# Patient Record
Sex: Female | Born: 1965 | Race: Asian | Hispanic: No | Marital: Married | Smoking: Never smoker
Health system: Southern US, Community
[De-identification: ages and names within clinical notes are randomized; demographics above are authoritative.]

## PROBLEM LIST (undated history)

## (undated) DIAGNOSIS — Z5189 Encounter for other specified aftercare: Secondary | ICD-10-CM

## (undated) DIAGNOSIS — B1910 Unspecified viral hepatitis B without hepatic coma: Secondary | ICD-10-CM

## (undated) DIAGNOSIS — G43909 Migraine, unspecified, not intractable, without status migrainosus: Secondary | ICD-10-CM

## (undated) DIAGNOSIS — D649 Anemia, unspecified: Secondary | ICD-10-CM

## (undated) HISTORY — DX: Migraine, unspecified, not intractable, without status migrainosus: G43.909

## (undated) HISTORY — DX: Anemia, unspecified: D64.9

## (undated) HISTORY — DX: Unspecified viral hepatitis B without hepatic coma: B19.10

## (undated) HISTORY — DX: Encounter for other specified aftercare: Z51.89

---

## 2005-06-14 ENCOUNTER — Ambulatory Visit (HOSPITAL_COMMUNITY): Admission: RE | Admit: 2005-06-14 | Discharge: 2005-06-14 | Payer: Self-pay | Admitting: *Deleted

## 2006-01-07 ENCOUNTER — Other Ambulatory Visit: Admission: RE | Admit: 2006-01-07 | Discharge: 2006-01-07 | Payer: Self-pay | Admitting: Obstetrics and Gynecology

## 2006-06-18 ENCOUNTER — Ambulatory Visit (HOSPITAL_COMMUNITY): Admission: RE | Admit: 2006-06-18 | Discharge: 2006-06-18 | Payer: Self-pay | Admitting: Internal Medicine

## 2006-06-28 ENCOUNTER — Encounter: Admission: RE | Admit: 2006-06-28 | Discharge: 2006-06-28 | Payer: Self-pay | Admitting: Internal Medicine

## 2006-12-03 ENCOUNTER — Ambulatory Visit (HOSPITAL_BASED_OUTPATIENT_CLINIC_OR_DEPARTMENT_OTHER): Admission: RE | Admit: 2006-12-03 | Discharge: 2006-12-03 | Payer: Self-pay | Admitting: Urology

## 2007-03-29 ENCOUNTER — Emergency Department (HOSPITAL_COMMUNITY): Admission: EM | Admit: 2007-03-29 | Discharge: 2007-03-30 | Payer: Self-pay | Admitting: Emergency Medicine

## 2007-06-05 ENCOUNTER — Emergency Department (HOSPITAL_COMMUNITY): Admission: EM | Admit: 2007-06-05 | Discharge: 2007-06-05 | Payer: Self-pay | Admitting: Emergency Medicine

## 2007-08-18 ENCOUNTER — Emergency Department (HOSPITAL_COMMUNITY): Admission: EM | Admit: 2007-08-18 | Discharge: 2007-08-18 | Payer: Self-pay | Admitting: Emergency Medicine

## 2007-11-27 ENCOUNTER — Encounter: Admission: RE | Admit: 2007-11-27 | Discharge: 2007-11-27 | Payer: Self-pay | Admitting: Gastroenterology

## 2008-01-15 ENCOUNTER — Ambulatory Visit (HOSPITAL_COMMUNITY): Admission: RE | Admit: 2008-01-15 | Discharge: 2008-01-15 | Payer: Self-pay | Admitting: Family Medicine

## 2008-08-31 IMAGING — CR DG MANDIBLE 1-3V
4 series · 4 of 4 positions shown · non-contrast
Comparison: none

CLINICAL DATA: 41 year-old-female jaw locked open.  
 MANDIBLE ? 4 VIEW:

[w townes *]
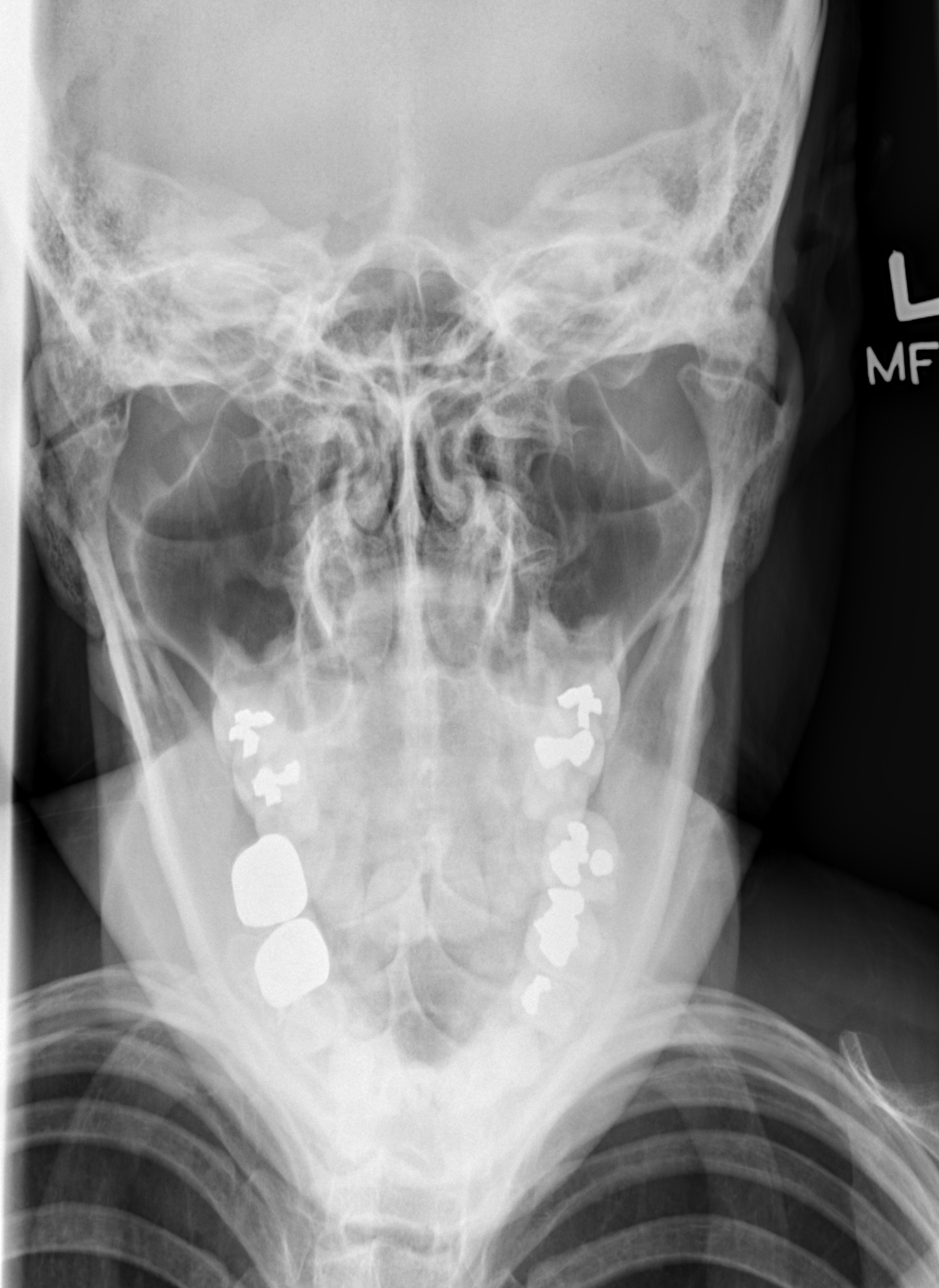

[w skull a.p./p.a.]
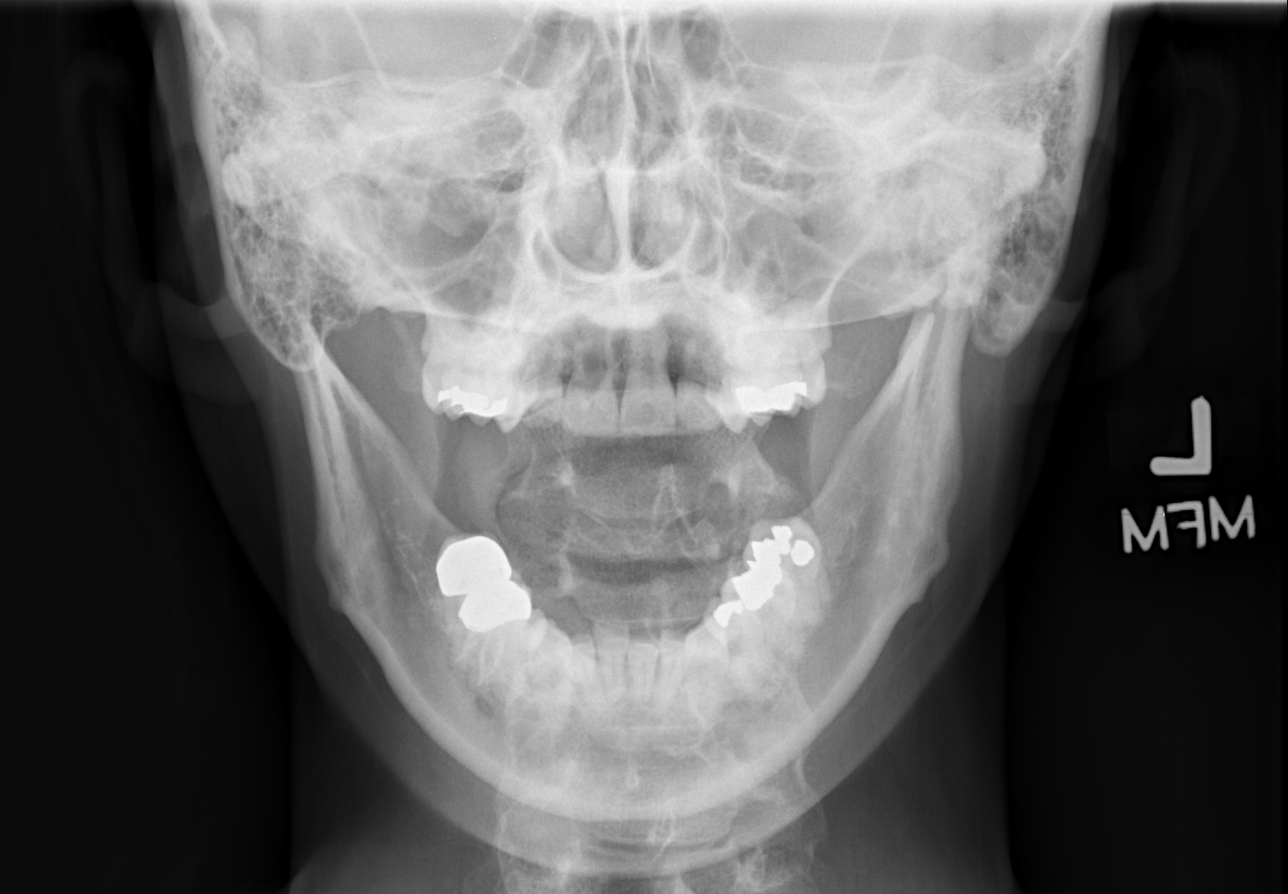

[w skull lat *]
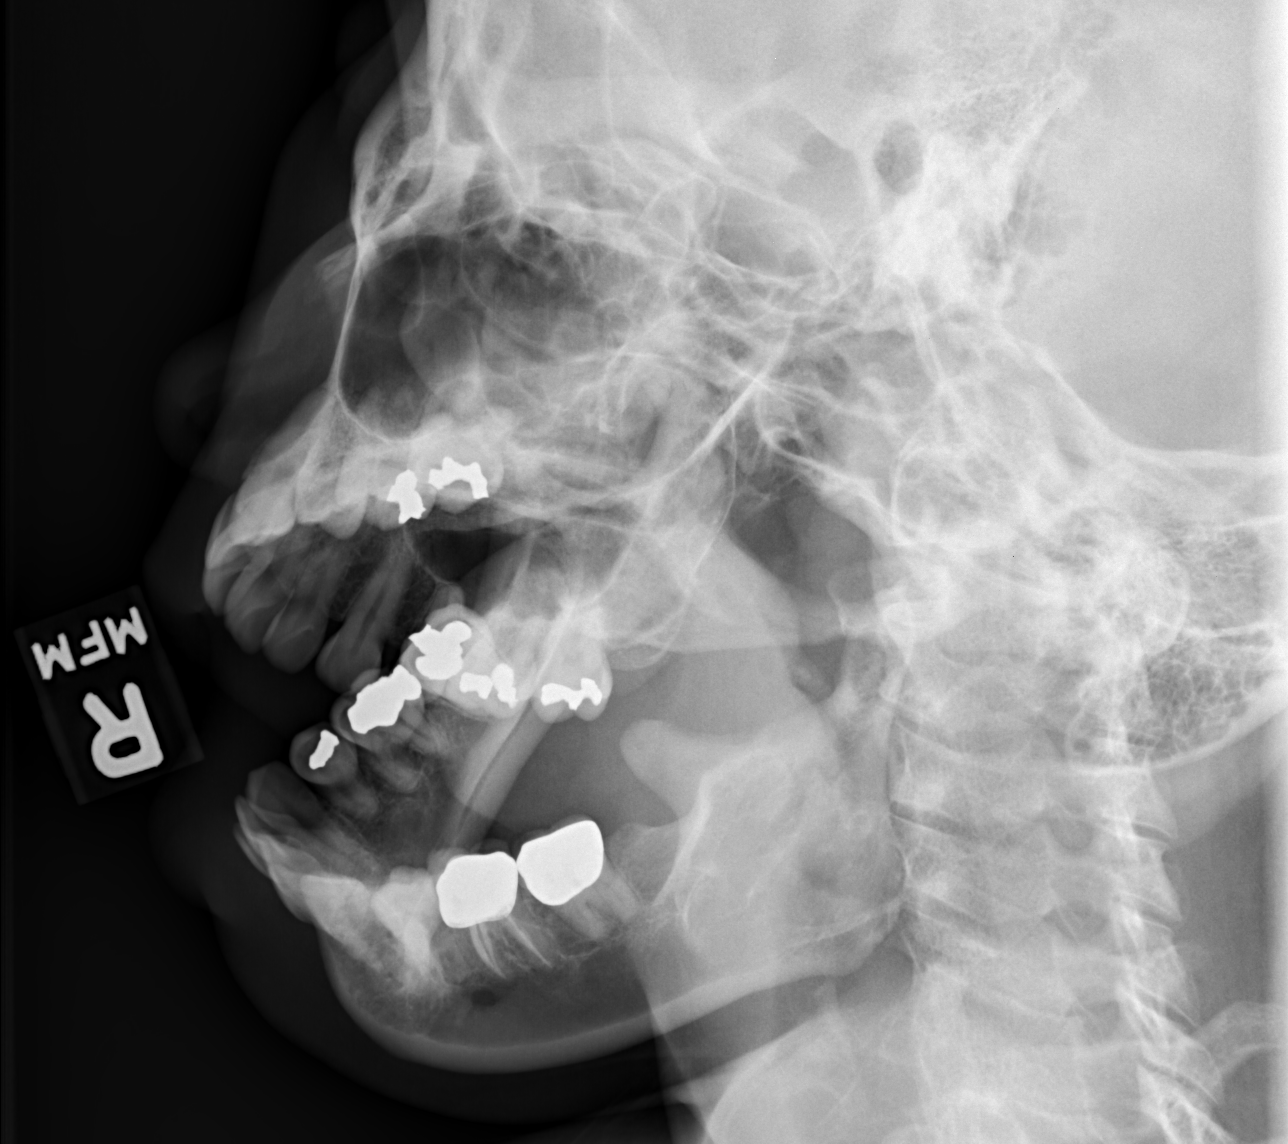

[w skull lat]
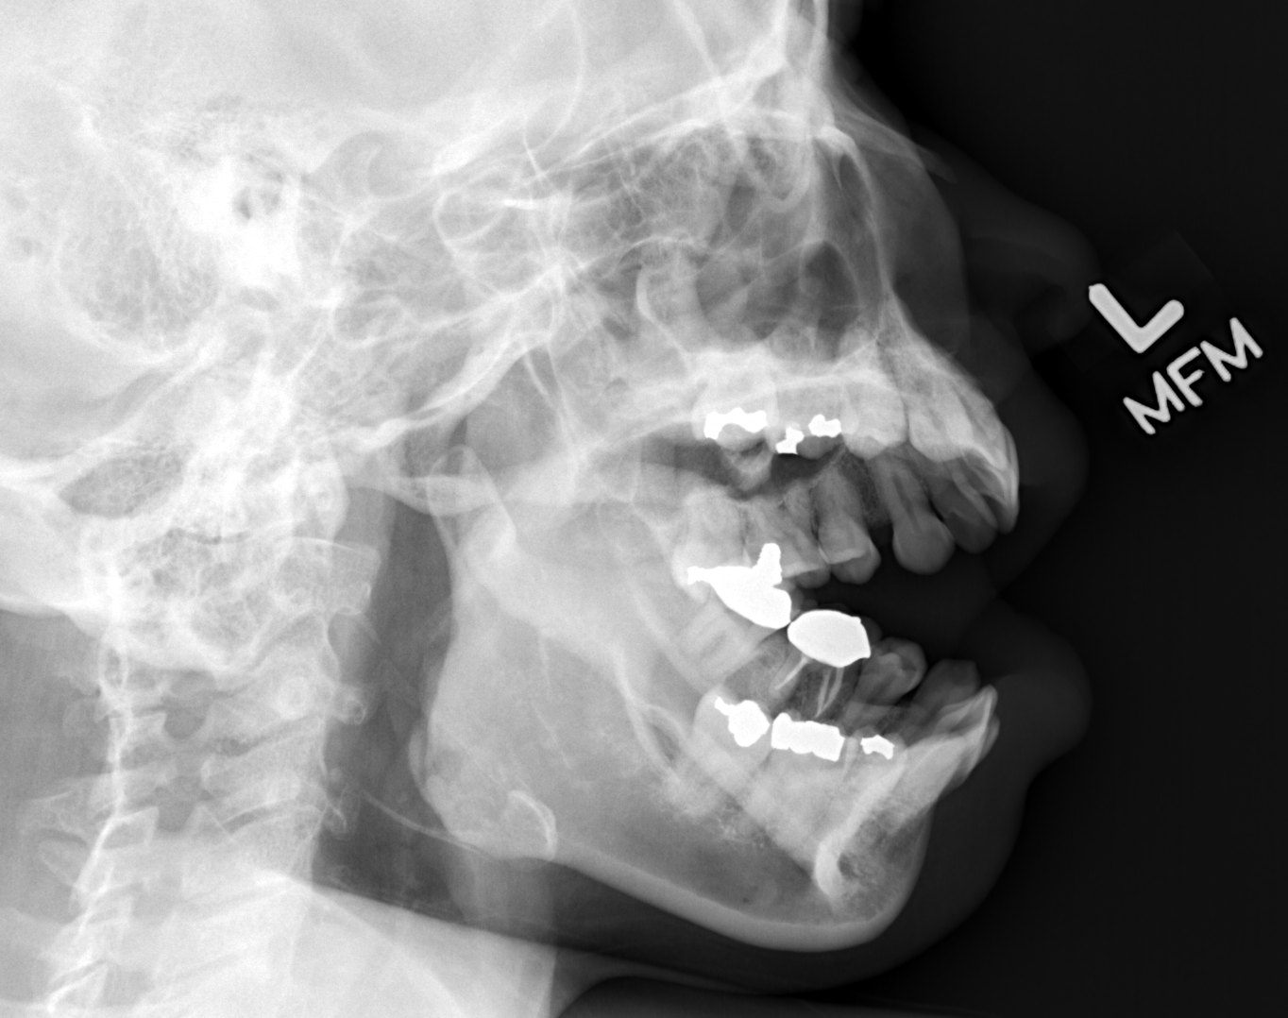

[4 of 4 positions shown; findings below may reference images not displayed]

FINDINGS: No mandible fractures are seen.  Both mandibular condyles are located under the condylar eminence in a slightly forward position which is not unusual for the patient having their mouth open.  The findings may be due to temporomandibular disc disease.  MR Reck be helpful for further evaluation of this finding.
IMPRESSION: 1.   No mandible fracture is seen.
 2.  Both mandibular condyles are translated anteriorly under the condylar eminence which is normal with the mouth open.  The findings may be due to temporomandibular disc disease.   MR Reck be helpful for further evaluation of this.

## 2009-05-17 ENCOUNTER — Emergency Department (HOSPITAL_COMMUNITY): Admission: EM | Admit: 2009-05-17 | Discharge: 2009-05-18 | Payer: Self-pay | Admitting: Emergency Medicine

## 2009-07-18 ENCOUNTER — Encounter: Admission: RE | Admit: 2009-07-18 | Discharge: 2009-07-18 | Payer: Self-pay | Admitting: Gastroenterology

## 2010-03-08 ENCOUNTER — Emergency Department (HOSPITAL_COMMUNITY)
Admission: EM | Admit: 2010-03-08 | Discharge: 2010-03-08 | Payer: Self-pay | Source: Home / Self Care | Admitting: Emergency Medicine

## 2010-03-10 ENCOUNTER — Encounter
Admission: RE | Admit: 2010-03-10 | Discharge: 2010-03-10 | Payer: Self-pay | Source: Home / Self Care | Attending: Orthopedic Surgery | Admitting: Orthopedic Surgery

## 2010-07-12 ENCOUNTER — Other Ambulatory Visit: Payer: Self-pay | Admitting: Gastroenterology

## 2010-07-12 DIAGNOSIS — C220 Liver cell carcinoma: Secondary | ICD-10-CM

## 2010-07-14 ENCOUNTER — Other Ambulatory Visit: Payer: Self-pay

## 2010-07-20 ENCOUNTER — Other Ambulatory Visit: Payer: Self-pay

## 2010-07-20 ENCOUNTER — Ambulatory Visit
Admission: RE | Admit: 2010-07-20 | Discharge: 2010-07-20 | Disposition: A | Discharge status: Home / Self Care | Payer: BC Managed Care – PPO | Source: Ambulatory Visit | Attending: Gastroenterology | Admitting: Gastroenterology

## 2010-07-20 DIAGNOSIS — C220 Liver cell carcinoma: Secondary | ICD-10-CM

## 2010-08-15 NOTE — Op Note (Signed)
NAMEARNETT, GALINDEZ                 ACCOUNT NO.:  [id number]   MEDICAL RECORD NO.:  [id number]         PATIENT TYPE:  LAMB   LOCATION:                               FACILITY:  [facility]   PHYSICIAN:  Martina Sinner, MD DATE OF BIRTH:  10-26-1965   DATE OF PROCEDURE:  12/03/2006  DATE OF DISCHARGE:                               OPERATIVE REPORT   PREOPERATIVE DIAGNOSIS:  Stress urinary incontinence and pelvic  pressure.   POSTOPERATIVE DIAGNOSIS:  Stress urinary incontinence, interstitial  cystitis.   PROCEDURE PERFORMED:  Sling cystourethropexy [facility]) plus cystoscopy  plus hydrodistention.   Ms. Muzio has urinary frequency associated with pelvic pressure.  Symptoms have returned.  She still voiding frequently.  She has stress  incontinence.   The patient is prepped and draped in the usual fashion.  Extra care was  taken to position her legs to minimize the risk compartment syndrome,  neuropathy and DVT. She was given preoperative antibiotics and  preoperative lab tests were normal.   Two 1-cm incisions were made 1 fingerbreadth above the symphysis pubis,  1.5 cm lateral to the midline.  A 2-cm incision was made overlying the  mid urethra.  Lidocaine/epinephrine mixture was utilized for hemostasis  and to develop a thick pubocervical flap.  I sharply dissect the  urethrovesical angle bilaterally.   With the bladder emptied, I passed the [facility] needle on top of and along  the back of the symphysis pubis parallel to the midline.  I delivered  the needle onto the palp of my index finger bilaterally.  I then  cystoscoped the patient.  There is no bladder injury.  There was no  urethral injury.  There was efflux of indigo carmine from both ureteral  orifices.  With movement of the trocars, there was no deflection into  the bladder.   With the bladder empty, the [facility] sling was attached and brought up  through the retropubic space.  It was tensioned over the fat part of a  moderate-sized [employer].  I cut below the blue dot, irrigated the sheath  and removed the sheath.  I was very happy with the position of the sling  in the mid urethra.  It laid in flat and I could hypermobilize it in the  midline and laterally.  There is little to no spring back.   All incisions were irrigated.  Vaginal incision was closed with running  2-0 Vicryl followed by 2 interrupted sutures.  The skin was closed with  4-0 subcuticular Vicryl and Dermabond.   Prior to performing the sling, I did a cystoscopy and hydrodistention.  She was hydrodistended to 800 mL.  Her bladder mucosa initially was  completely normal.  When I re-examine her bladder after hydrodistention,  there were diffuse fine petechiae throughout the bladder.  There was  more substantial petechiae at 5 o'clock and 7 o'clock around the  ureteral orifices.  The findings were in keeping with a diagnosis of  interstitial cystitis.   Ms. Gancarz be followed as per protocol.  She will be treated for mild  interstitial cystitis.  Hopefully the sling will help her stress  incontinence.           ______________________________  Martina Sinner, MD  Electronically Signed     SAM/MEDQ  D:  12/03/2006  T:  12/03/2006  Job:  [id number]

## 2010-11-16 ENCOUNTER — Emergency Department (HOSPITAL_COMMUNITY)
Admission: EM | Admit: 2010-11-16 | Discharge: 2010-11-16 | Disposition: A | Discharge status: Home / Self Care | Payer: BC Managed Care – PPO | Attending: Emergency Medicine | Admitting: Emergency Medicine

## 2010-11-16 ENCOUNTER — Emergency Department (HOSPITAL_COMMUNITY): Payer: BC Managed Care – PPO

## 2010-11-16 DIAGNOSIS — S0300XA Dislocation of jaw, unspecified side, initial encounter: Secondary | ICD-10-CM | POA: Insufficient documentation

## 2010-11-16 DIAGNOSIS — R6884 Jaw pain: Secondary | ICD-10-CM

## 2010-11-16 DIAGNOSIS — X58XXXA Exposure to other specified factors, initial encounter: Secondary | ICD-10-CM | POA: Insufficient documentation

## 2011-01-12 LAB — CBC
MCV: 95.1
Platelets: 252
WBC: 3.6 — ABNORMAL LOW

## 2011-01-12 LAB — TYPE AND SCREEN
ABO/RH(D): AB POS
Antibody Screen: NEGATIVE

## 2011-01-12 LAB — PROTIME-INR
INR: 0.9
Prothrombin Time: 12.7

## 2011-01-12 LAB — ABO/RH: ABO/RH(D): AB POS

## 2011-05-24 ENCOUNTER — Ambulatory Visit (INDEPENDENT_AMBULATORY_CARE_PROVIDER_SITE_OTHER): Payer: BC Managed Care – PPO | Admitting: Family Medicine

## 2011-05-24 VITALS — BP 110/74 | HR 74 | Temp 97.9°F | Resp 16 | Ht 63.0 in | Wt 131.0 lb

## 2011-05-24 DIAGNOSIS — G43109 Migraine with aura, not intractable, without status migrainosus: Secondary | ICD-10-CM | POA: Insufficient documentation

## 2011-05-24 DIAGNOSIS — B009 Herpesviral infection, unspecified: Secondary | ICD-10-CM

## 2011-05-24 MED ORDER — KETOROLAC TROMETHAMINE 60 MG/2ML IM SOLN
60.0000 mg | Freq: Once | INTRAMUSCULAR | Status: AC
  Administered 2011-05-24: 60 mg via INTRAMUSCULAR

## 2011-05-24 MED ORDER — SUMATRIPTAN 20 MG/ACT NA SOLN: NASAL | Status: DC

## 2011-05-24 MED ORDER — DIAZEPAM 5 MG PO TABS: ORAL_TABLET | ORAL | Status: DC

## 2011-05-24 MED ORDER — VALACYCLOVIR HCL 1 G PO TABS: 1000.0000 mg | ORAL_TABLET | Freq: Two times a day (BID) | ORAL | Status: AC

## 2011-05-24 NOTE — Progress Notes (Signed)
  Subjective:    Patient ID: Dawn Hatfield, female    DOB: Jun 12, 1965, 46 y.o.   MRN: [id number]  Migraine  This is a new problem. The current episode started in the past 7 days. The problem occurs constantly. The problem has been unchanged. The pain is located in the right unilateral region. The pain quality is similar to prior headaches. The quality of the pain is described as pulsating. The pain is at a severity of 5/10. The pain is moderate. Pertinent negatives include no blurred vision or fever. The symptoms are aggravated by bright light, menstrual cycle and noise. She has tried darkened room and NSAIDs for the symptoms. The treatment provided mild relief. Her past medical history is significant for migraine headaches.   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  Eyes: Negative for blurred vision.   .    Objective:   Physical Exam  Constitutional: She is oriented to person, place, and time. She appears well-developed and well-nourished.  HENT:  Head: Normocephalic and atraumatic.  Eyes: EOM are normal. Pupils are equal, round, and reactive to light.  Neck: Neck supple.  Cardiovascular: Normal rate, regular rhythm and normal heart sounds.   Pulmonary/Chest: Effort normal and breath sounds normal.  Neurological: She is alert and oriented to person, place, and time. She has normal strength. No cranial nerve deficit or sensory deficit.  Psychiatric: She has a normal mood and affect.          Assessment & Plan:   1. Migraine headache with aura  ketorolac (TORADOL) injection 60 mg, diazepam (VALIUM) 5 MG tablet, SUMAtriptan (IMITREX) 20 MG/ACT nasal spray  2. HSV-1 infection  valACYclovir (VALTREX) 1000 MG tablet    Anticipatory guidance

## 2011-06-18 ENCOUNTER — Encounter: Payer: Self-pay | Admitting: Family Medicine

## 2011-06-25 ENCOUNTER — Ambulatory Visit: Payer: BC Managed Care – PPO | Admitting: Family Medicine

## 2011-06-25 VITALS — BP 123/76 | HR 58 | Temp 98.4°F | Resp 16 | Ht 63.0 in | Wt 123.0 lb

## 2011-06-25 DIAGNOSIS — R51 Headache: Secondary | ICD-10-CM

## 2011-06-25 DIAGNOSIS — E860 Dehydration: Secondary | ICD-10-CM

## 2011-06-25 DIAGNOSIS — R070 Pain in throat: Secondary | ICD-10-CM

## 2011-06-25 DIAGNOSIS — R5381 Other malaise: Secondary | ICD-10-CM

## 2011-06-25 MED ORDER — KETOROLAC TROMETHAMINE 60 MG/2ML IM SOLN
60.0000 mg | Freq: Once | INTRAMUSCULAR | Status: AC
  Administered 2011-06-25: 60 mg via INTRAMUSCULAR

## 2011-06-25 NOTE — Progress Notes (Signed)
  Subjective:    Patient ID: Dawn Hatfield, female    DOB: [birth year], 46 y.o.   MRN: [id number]  HPI 46 yo female with migraine, nausea, and dizziness.  Woke up dizzy Wednesday morning with nausea and vomitting.  Vomitted all day.  Stopped after about 20 hours and felt weak rest of the week. Today started with nausea and felt migraine coming on with her usual aura - nausea and strange vision.  No full pain yet, eventhough it has been several hours.  Has not taken any of her headache meds.    Has felt bad for several days.  "something isn't right inmy body".  "I don't feel right".  Not sleeping well because of pain from cervical pinched nerve.  Following with neurology.    Review of Systems Negative except as per HPI     Objective:   Physical Exam  Constitutional: Vital signs are normal. She appears well-developed and well-nourished. She is active and cooperative.  HENT:  Head: Normocephalic.  Right Ear: Hearing, tympanic membrane, external ear and ear canal normal.  Left Ear: Hearing, tympanic membrane, external ear and ear canal normal.  Nose: Nose normal.  Mouth/Throat: Uvula is midline, oropharynx is clear and moist and mucous membranes are normal.  Eyes: Conjunctivae, EOM and lids are normal. Pupils are equal, round, and reactive to light. Right eye exhibits no nystagmus. Left eye exhibits no nystagmus.  Cardiovascular: Normal rate, regular rhythm, normal heart sounds, intact distal pulses and normal pulses.   Pulmonary/Chest: Effort normal and breath sounds normal.  Neurological: She is alert. She has normal strength. No cranial nerve deficit. She displays a negative Romberg sign. Coordination and gait normal.          Assessment & Plan:  Viral GE  With resulting malaise and dehydration Atypical migraine  Toradol here.  Valium when she gets home.  Rehydrate.  Check labs.  If not improved in 2-3 days, return.

## 2011-06-26 ENCOUNTER — Ambulatory Visit: Payer: BC Managed Care – PPO | Admitting: Family Medicine

## 2011-06-26 ENCOUNTER — Other Ambulatory Visit: Payer: Self-pay | Admitting: Gastroenterology

## 2011-06-26 VITALS — BP 129/72 | HR 58 | Temp 98.1°F | Resp 16 | Ht 63.0 in | Wt 124.0 lb

## 2011-06-26 DIAGNOSIS — F4320 Adjustment disorder, unspecified: Secondary | ICD-10-CM

## 2011-06-26 DIAGNOSIS — R110 Nausea: Secondary | ICD-10-CM

## 2011-06-26 DIAGNOSIS — Z658 Other specified problems related to psychosocial circumstances: Secondary | ICD-10-CM

## 2011-06-26 DIAGNOSIS — B181 Chronic viral hepatitis B without delta-agent: Secondary | ICD-10-CM

## 2011-06-26 LAB — COMPREHENSIVE METABOLIC PANEL
ALT: 12 U/L (ref 0–35)
AST: 17 U/L (ref 0–37)
Albumin: 4.4 g/dL (ref 3.5–5.2)
Alkaline Phosphatase: 55 U/L (ref 39–117)
BUN: 8 mg/dL (ref 6–23)
Creat: 0.77 mg/dL (ref 0.50–1.10)
Potassium: 5 mEq/L (ref 3.5–5.3)

## 2011-06-26 LAB — CBC
HCT: 40.6 % (ref 36.0–46.0)
MCHC: 32.3 g/dL (ref 30.0–36.0)
Platelets: 344 10*3/uL (ref 150–400)
RDW: 12.7 % (ref 11.5–15.5)
WBC: 5.3 10*3/uL (ref 4.0–10.5)

## 2011-06-26 NOTE — Progress Notes (Signed)
  Subjective:    Patient ID: Dawn Hatfield, female    DOB: Jun 24, 1965, 46 y.o.   MRN: [id number]  HPI See office notes from yesterday. Continued nausea and malaise  Stressors mounting since December; issues with husbands health, children very busy, financial expenses.  Overwhellmed  Recurrent migraines Muscle tension (B) shoulders     Review of Systems     Objective:   Physical Exam  Constitutional: She appears well-developed.  Neck: Neck supple.  Cardiovascular: Normal rate, regular rhythm and normal heart sounds.   Pulmonary/Chest: Effort normal and breath sounds normal.  Neurological: She is alert.  Skin: Skin is warm.      Results for orders placed in visit on 06/25/11  CBC      Component Value Range   WBC 5.3  4.0 - 10.5 (K/uL)   RBC 4.26  3.87 - 5.11 (MIL/uL)   Hemoglobin 13.1  12.0 - 15.0 (g/dL)   HCT 95.6  21.3 - 08.6 (%)   MCV 95.3  78.0 - 100.0 (fL)   MCH 30.8  26.0 - 34.0 (pg)   MCHC 32.3  30.0 - 36.0 (g/dL)   RDW 57.8  46.9 - 62.9 (%)   Platelets 344  150 - 400 (K/uL)  COMPREHENSIVE METABOLIC PANEL      Component Value Range   Sodium 138  135 - 145 (mEq/L)   Potassium 5.0  3.5 - 5.3 (mEq/L)   Chloride 106  96 - 112 (mEq/L)   CO2 27  19 - 32 (mEq/L)   Glucose, Bld 95  70 - 99 (mg/dL)   BUN 8  6 - 23 (mg/dL)   Creat 5.28  4.13 - 2.44 (mg/dL)   Total Bilirubin 0.4  0.3 - 1.2 (mg/dL)   Alkaline Phosphatase 55  39 - 117 (U/L)   AST 17  0 - 37 (U/L)   ALT 12  0 - 35 (U/L)   Total Protein 7.4  6.0 - 8.3 (g/dL)   Albumin 4.4  3.5 - 5.2 (g/dL)   Calcium 9.2  8.4 - 01.0 (mg/dL)  TSH      Component Value Range   TSH 1.416  0.350 - 4.500 (uIU/mL)       Assessment & Plan:   1. Adjustment disorder   2. Psychosocial stressors    Out of work 3/20- 4/8 Continue current medications Stress management Extended OV

## 2011-07-02 DIAGNOSIS — Z0271 Encounter for disability determination: Secondary | ICD-10-CM

## 2011-07-16 ENCOUNTER — Ambulatory Visit (INDEPENDENT_AMBULATORY_CARE_PROVIDER_SITE_OTHER): Payer: BC Managed Care – PPO | Admitting: Family Medicine

## 2011-07-16 ENCOUNTER — Encounter: Payer: Self-pay | Admitting: Family Medicine

## 2011-07-16 ENCOUNTER — Telehealth: Payer: Self-pay

## 2011-07-16 VITALS — BP 119/82 | HR 67 | Temp 97.2°F | Resp 16 | Ht 62.75 in | Wt 121.8 lb

## 2011-07-16 DIAGNOSIS — Z0000 Encounter for general adult medical examination without abnormal findings: Secondary | ICD-10-CM

## 2011-07-16 DIAGNOSIS — G43019 Migraine without aura, intractable, without status migrainosus: Secondary | ICD-10-CM

## 2011-07-16 MED ORDER — KETOROLAC TROMETHAMINE 60 MG/2ML IM SOLN
60.0000 mg | Freq: Once | INTRAMUSCULAR | Status: AC
  Administered 2011-07-16: 60 mg via INTRAMUSCULAR

## 2011-07-16 NOTE — Telephone Encounter (Signed)
DR Hal Hope   PT WOULD LIKE A RETURNED CALL IN REGARDS TO HER WORK AND WORKING PART TIME    PLEASE RETURN CALL TO PATIENT

## 2011-07-16 NOTE — Progress Notes (Signed)
  Subjective:    Patient ID: Dawn Hatfield, female    DOB: 06-01-1965, 46 y.o.   MRN: [id number]  HPI  Patient presents for CPE  1) Migraines 2-3 headaches weekly given psychosocial stressor; initially patient experiences muscle tension in nape then headache evolves to include phono/photophobia and N/V  With headaches limited ability to focus.  Abortive agents cause sedation.  Imitrex intermittently effectively. Interested in referral. No focal deficits.  Triggers can also include bright lights and prolong computer work.  2) Chronic active hepatitis B- Baraclude daily  3) Cervical radiculapathy- persistant neck symptoms with paresthesias down (R) arm; Sees neurosurgery([facility])  4) Adjustment disorder- significant stressors at home; husband disabled and issues with children; all which are affecting ability to work. Daughter receiving counseling.  Review of Systems See  scanned intake form   Objective:   Physical Exam  Constitutional: She appears well-developed.  HENT:  Head: Normocephalic.  Right Ear: External ear normal.  Left Ear: External ear normal.  Nose: Nose normal.  Mouth/Throat: Oropharynx is clear and moist.  Eyes: Conjunctivae and EOM are normal. Pupils are equal, round, and reactive to light.  Neck: Normal range of motion. Neck supple. No thyromegaly present.  Cardiovascular: Normal rate, regular rhythm and normal heart sounds.   Pulmonary/Chest: Effort normal and breath sounds normal.  Abdominal: Soft. Bowel sounds are normal.  Genitourinary: Uterus normal. No vaginal discharge found.  Musculoskeletal: Normal range of motion.  Skin: Skin is warm.           Assessment & Plan:   1. Routine general medical examination at a health care facility  Pap IG (Image Guided), ketorolac (TORADOL) injection 60 mg

## 2011-07-18 LAB — PAP IG (IMAGE GUIDED)

## 2011-07-18 NOTE — Telephone Encounter (Signed)
LMOM to cb

## 2011-07-19 ENCOUNTER — Ambulatory Visit
Admission: RE | Admit: 2011-07-19 | Discharge: 2011-07-19 | Disposition: A | Discharge status: Home / Self Care | Payer: BC Managed Care – PPO | Source: Ambulatory Visit | Attending: Gastroenterology | Admitting: Gastroenterology

## 2011-07-19 DIAGNOSIS — B181 Chronic viral hepatitis B without delta-agent: Secondary | ICD-10-CM

## 2011-07-20 NOTE — Telephone Encounter (Signed)
Pt states she has questions for dr Hal Hope - please wait for dr Hal Hope to come in to call her - states she keeps missing the call and is usually in the office when we try. Please call when richter gets here.   Best: [phone]  bf

## 2011-07-20 NOTE — Telephone Encounter (Signed)
Dr. Richter can you please advise on this? 

## 2011-07-20 NOTE — Telephone Encounter (Signed)
LMOM to call back

## 2011-07-21 NOTE — Progress Notes (Deleted)
Quick Note:  Please call or send patient copy of normal labs. Thanks! KR  ______ 

## 2011-07-21 NOTE — Telephone Encounter (Signed)
Spoke with patient who is needing a note requesting restricted work hours due to medical and social circumstances. I agreed to write such a letter and will leave letter at the front office 07/22/11 for pick up.

## 2011-07-21 NOTE — Telephone Encounter (Signed)
LM on both cell and home numbers asking for return call.

## 2011-07-22 ENCOUNTER — Encounter: Payer: Self-pay | Admitting: *Deleted

## 2011-09-18 ENCOUNTER — Ambulatory Visit: Payer: BC Managed Care – PPO | Admitting: Internal Medicine

## 2011-09-18 VITALS — BP 88/52 | HR 60 | Temp 98.2°F | Resp 16 | Ht 63.0 in | Wt 120.0 lb

## 2011-09-18 DIAGNOSIS — G43909 Migraine, unspecified, not intractable, without status migrainosus: Secondary | ICD-10-CM

## 2011-09-18 DIAGNOSIS — R110 Nausea: Secondary | ICD-10-CM

## 2011-09-18 DIAGNOSIS — B1910 Unspecified viral hepatitis B without hepatic coma: Secondary | ICD-10-CM

## 2011-09-18 MED ORDER — IBUPROFEN 800 MG PO TABS: 800.0000 mg | ORAL_TABLET | Freq: Three times a day (TID) | ORAL | Status: DC | PRN

## 2011-09-18 MED ORDER — KETOROLAC TROMETHAMINE 60 MG/2ML IM SOLN
60.0000 mg | Freq: Once | INTRAMUSCULAR | Status: AC
  Administered 2011-09-18: 60 mg via INTRAMUSCULAR

## 2011-09-18 MED ORDER — PROMETHAZINE HCL 25 MG PO TABS: 25.0000 mg | ORAL_TABLET | Freq: Three times a day (TID) | ORAL | Status: DC | PRN

## 2011-09-18 MED ORDER — SUMATRIPTAN SUCCINATE 50 MG PO TABS: 50.0000 mg | ORAL_TABLET | ORAL | Status: DC | PRN

## 2011-09-18 NOTE — Progress Notes (Signed)
  Subjective:    Patient ID: Dawn Hatfield, female    DOB: [birth year], 46 y.o.   MRN: [id number]  HPI Migraine , her usual. No sxs of any complication.  Review of Systems    hepatitis b, on meds  Objective:   Physical Exam Normal except migrain Normal neuro.       Assessment & Plan:  Toradol 60mg  Reviewed her meds Knows her triggers

## 2012-01-02 ENCOUNTER — Other Ambulatory Visit: Payer: Self-pay | Admitting: Internal Medicine

## 2012-04-02 ENCOUNTER — Ambulatory Visit: Payer: BC Managed Care – PPO | Admitting: Emergency Medicine

## 2012-04-02 VITALS — BP 122/81 | HR 88 | Temp 98.9°F | Resp 16 | Ht 63.0 in | Wt 119.0 lb

## 2012-04-02 DIAGNOSIS — G43109 Migraine with aura, not intractable, without status migrainosus: Secondary | ICD-10-CM

## 2012-04-02 MED ORDER — DIAZEPAM 5 MG PO TABS: ORAL_TABLET | ORAL | Status: DC

## 2012-04-02 MED ORDER — CYCLOBENZAPRINE HCL 10 MG PO TABS: 10.0000 mg | ORAL_TABLET | Freq: Three times a day (TID) | ORAL | Status: DC | PRN

## 2012-04-02 MED ORDER — PROMETHAZINE HCL 25 MG/ML IJ SOLN
25.0000 mg | Freq: Once | INTRAMUSCULAR | Status: AC
  Administered 2012-04-02: 25 mg via INTRAMUSCULAR

## 2012-04-02 MED ORDER — KETOROLAC TROMETHAMINE 60 MG/2ML IM SOLN
60.0000 mg | Freq: Once | INTRAMUSCULAR | Status: AC
  Administered 2012-04-02: 60 mg via INTRAMUSCULAR

## 2012-04-02 MED ORDER — SUMATRIPTAN SUCCINATE 50 MG PO TABS: 100.0000 mg | ORAL_TABLET | ORAL | Status: DC | PRN

## 2012-04-02 MED ORDER — GABAPENTIN 100 MG PO CAPS: 100.0000 mg | ORAL_CAPSULE | Freq: Three times a day (TID) | ORAL | Status: DC

## 2012-04-02 NOTE — Progress Notes (Signed)
[facility] [address] [place] [address] [phone]  Date:  04/02/2012   Name:  Dawn Hatfield   DOB:  November 14, 1965   MRN:  [id number]  PCP:  Dois Davenport., MD    Chief Complaint: Migraine and Medication Refill   History of Present Illness:  Dawn Hatfield is a 47 y.o. very pleasant female patient who presents with the following:  Developed a headache characteristic of her usual migraine last night.  Tried her usual flexeril and valium with no relief.  Took her imitrex today and it had no effect.  No antecedent illness or injury.  No neuro or visual symptoms other than photophobia.  No other complaints.  Patient Active Problem List  Diagnosis  . Migraine with aura  . Hepatitis B    Past Medical History  Diagnosis Date  . Migraine   . Hepatitis B   . Anemia   . Blood transfusion without reported diagnosis     History reviewed. No pertinent past surgical history.  History  Substance Use Topics  . Smoking status: Never Smoker   . Smokeless tobacco: Not on file  . Alcohol Use: Yes     Comment: socially-wine    History reviewed. No pertinent family history.  Allergies  Allergen Reactions  . Hydrocodone Nausea And Vomiting    dizziness    Medication list has been reviewed and updated.  Current Outpatient Prescriptions on File Prior to Visit  Medication Sig Dispense Refill  . diazepam (VALIUM) 5 MG tablet 1/2-1 po q 12hrs prn headache  30 tablet  1  . gabapentin (NEURONTIN) 100 MG capsule Take 100 mg by mouth 3 (three) times daily.      Marland Kitchen ibuprofen (ADVIL,MOTRIN) 800 MG tablet Take 1 tablet (800 mg total) by mouth every 8 (eight) hours as needed.  30 tablet  3  . SUMAtriptan (IMITREX) 50 MG tablet TAKE 1 TABLET (50 MG TOTAL) BY MOUTH EVERY 2 (TWO) HOURS AS NEEDED FOR MIGRAINE.  10 tablet  1  . tenofovir (VIREAD) 300 MG tablet Take 300 mg by mouth daily.      . valACYclovir (VALTREX) 1000 MG tablet Take 1 tablet (1,000 mg total) by mouth  2 (two) times daily.  4 tablet  6  . cyclobenzaprine (FLEXERIL) 10 MG tablet Take 10 mg by mouth 3 (three) times daily as needed.      . promethazine (PHENERGAN) 25 MG tablet Take 1 tablet (25 mg total) by mouth every 8 (eight) hours as needed for nausea.  20 tablet  1  . SUMAtriptan (IMITREX) 20 MG/ACT nasal spray One spray with onset of headache may repeat once after 2 hours if headache persists  1 Inhaler  6  . zolpidem (AMBIEN) 5 MG tablet Take 5 mg by mouth at bedtime as needed.        Review of Systems:  As per HPI, otherwise negative.    Physical Examination: Filed Vitals:   04/02/12 1234  BP: 122/81  Pulse: 88  Temp: 98.9 F (37.2 C)  Resp: 16   Filed Vitals:   04/02/12 1234  Height: 5\' 3"  (1.6 m)  Weight: 119 lb (53.978 kg)   Body mass index is 21.08 kg/(m^2). Ideal Body Weight: Weight in (lb) to have BMI = 25: 140.8   GEN: WDWN, moderately distressed, Non-toxic, A & O x 3 HEENT: Atraumatic, Normocephalic. Neck supple. No masses, No LAD. Ears and Nose: No external deformity. CV: RRR, No M/G/R. No  JVD. No thrill. No extra heart sounds. PULM: CTA B, no wheezes, crackles, rhonchi. No retractions. No resp. distress. No accessory muscle use. ABD: S, NT, ND, +BS. No rebound. No HSM. EXTR: No c/c/e NEURO Normal gait.  PSYCH: Normally interactive. Conversant. Not depressed or anxious appearing.  Calm demeanor.    Assessment and Plan: Migraine toradol Phenergan Refill meds Follow up as needed  Carmelina Dane, MD

## 2012-05-19 ENCOUNTER — Telehealth: Payer: Self-pay | Admitting: *Deleted

## 2012-05-19 MED ORDER — GABAPENTIN 100 MG PO CAPS: 100.0000 mg | ORAL_CAPSULE | Freq: Three times a day (TID) | ORAL | Status: DC

## 2012-05-19 MED ORDER — SUMATRIPTAN SUCCINATE 50 MG PO TABS: 100.0000 mg | ORAL_TABLET | ORAL | Status: DC | PRN

## 2012-05-19 NOTE — Telephone Encounter (Signed)
Error

## 2012-05-25 ENCOUNTER — Telehealth: Payer: Self-pay | Admitting: *Deleted

## 2012-05-25 ENCOUNTER — Other Ambulatory Visit: Payer: Self-pay | Admitting: *Deleted

## 2012-05-25 MED ORDER — GABAPENTIN 100 MG PO CAPS: 100.0000 mg | ORAL_CAPSULE | Freq: Three times a day (TID) | ORAL | Status: DC

## 2012-05-25 NOTE — Telephone Encounter (Signed)
[facility] [address] requesting refill on valtrex 1grm.

## 2012-05-26 MED ORDER — VALACYCLOVIR HCL 1 G PO TABS: 1000.0000 mg | ORAL_TABLET | Freq: Two times a day (BID) | ORAL | Status: DC

## 2012-05-26 NOTE — Telephone Encounter (Signed)
Rx sent to pharmacy   

## 2012-05-28 ENCOUNTER — Other Ambulatory Visit: Payer: Self-pay | Admitting: Radiology

## 2012-06-04 ENCOUNTER — Other Ambulatory Visit: Payer: Self-pay | Admitting: Emergency Medicine

## 2012-07-24 ENCOUNTER — Other Ambulatory Visit: Payer: Self-pay | Admitting: Gastroenterology

## 2012-07-24 DIAGNOSIS — B1910 Unspecified viral hepatitis B without hepatic coma: Secondary | ICD-10-CM

## 2012-07-30 ENCOUNTER — Ambulatory Visit
Admission: RE | Admit: 2012-07-30 | Discharge: 2012-07-30 | Disposition: A | Discharge status: Home / Self Care | Payer: BC Managed Care – PPO | Source: Ambulatory Visit | Attending: Gastroenterology | Admitting: Gastroenterology

## 2012-07-30 DIAGNOSIS — B1910 Unspecified viral hepatitis B without hepatic coma: Secondary | ICD-10-CM

## 2012-10-14 IMAGING — US US ABDOMEN COMPLETE
1 series · 14 of 25 positions shown · non-contrast
Comparison: Ultrasound of the abdomen of 07/20/2010

CLINICAL DATA: History of hepatitis B, hepatoma surveillance

COMPLETE ABDOMINAL ULTRASOUND

[Series 1: us abdomen complete · 0.26mm/px · 14 of 66 slices shown]
[im 1/66]
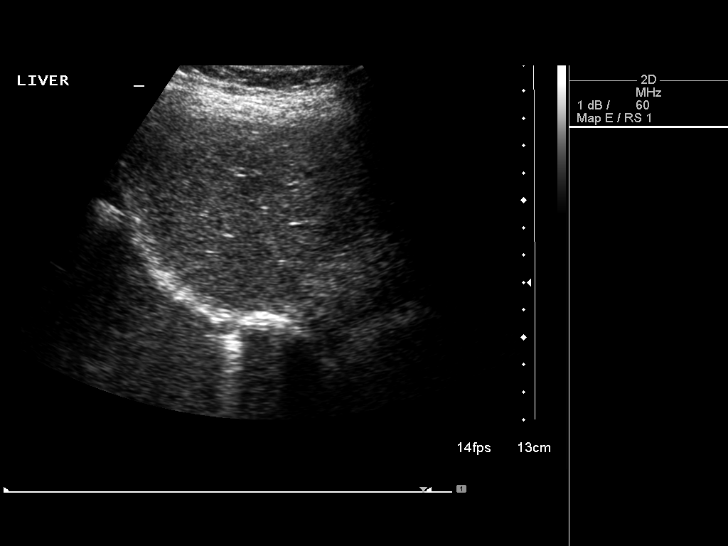
[im 6/66]
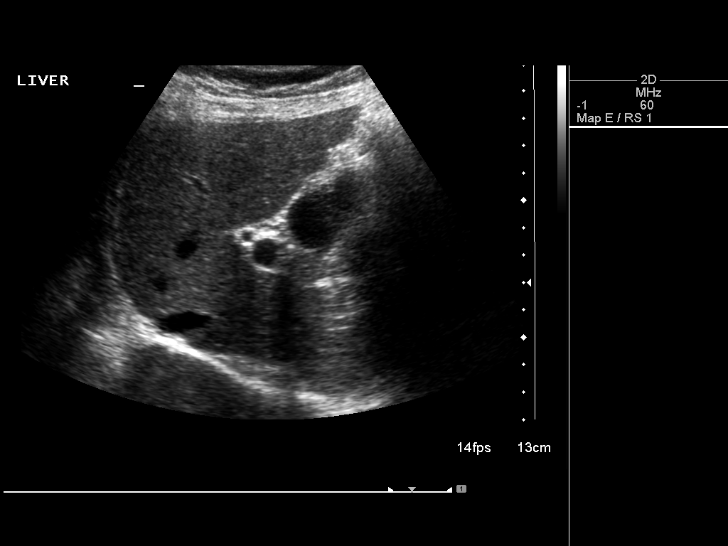
[im 11/66]
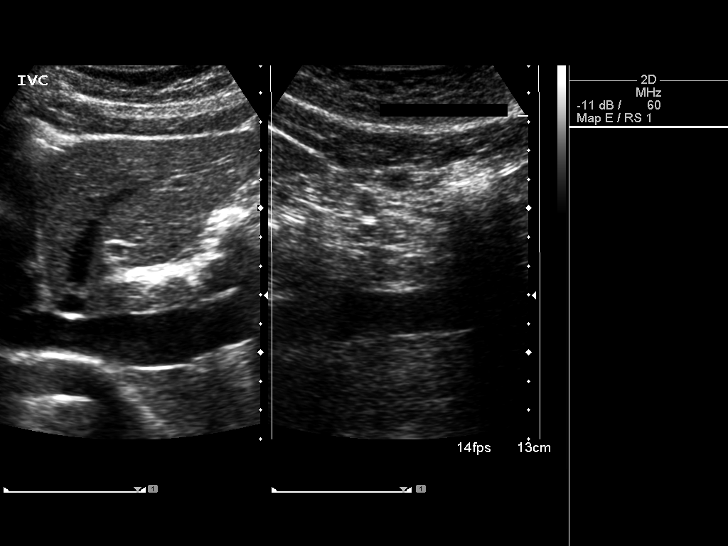
[im 17/66]
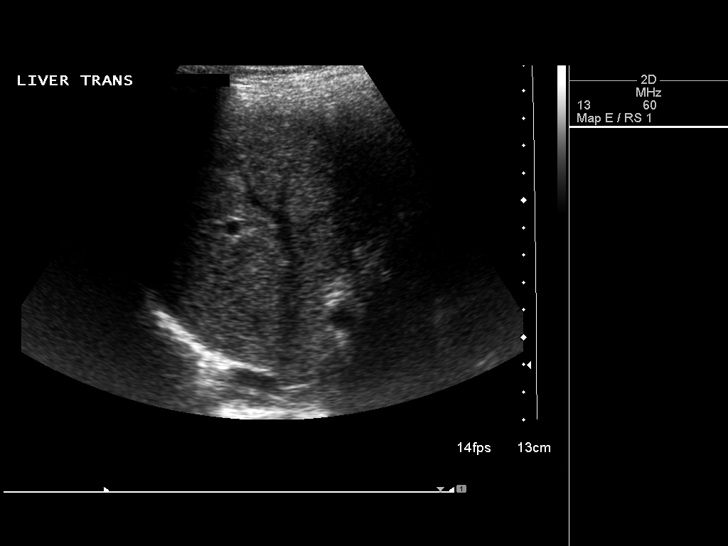
[im 22/66]
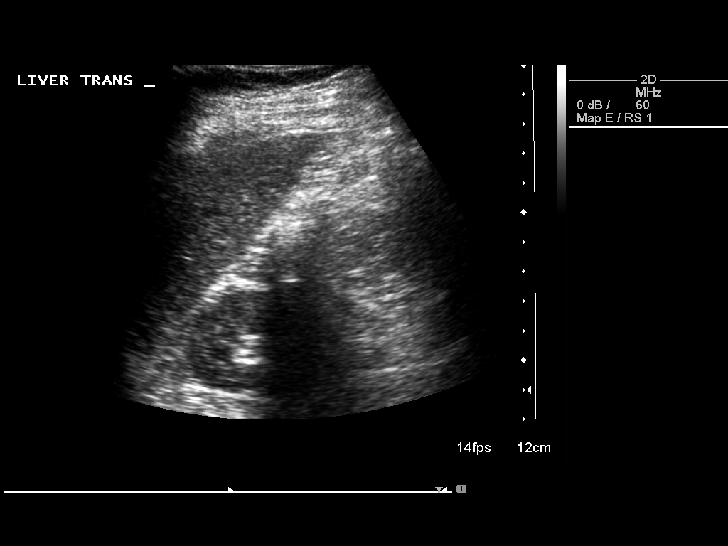
[im 25/66]
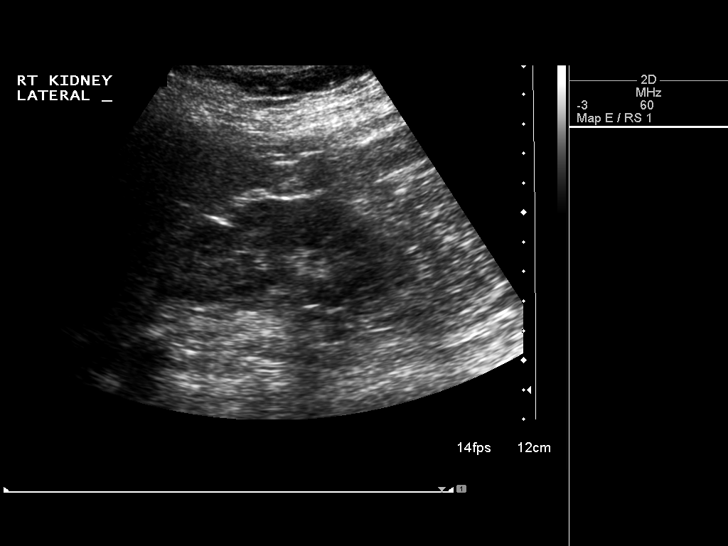
[im 30/66]
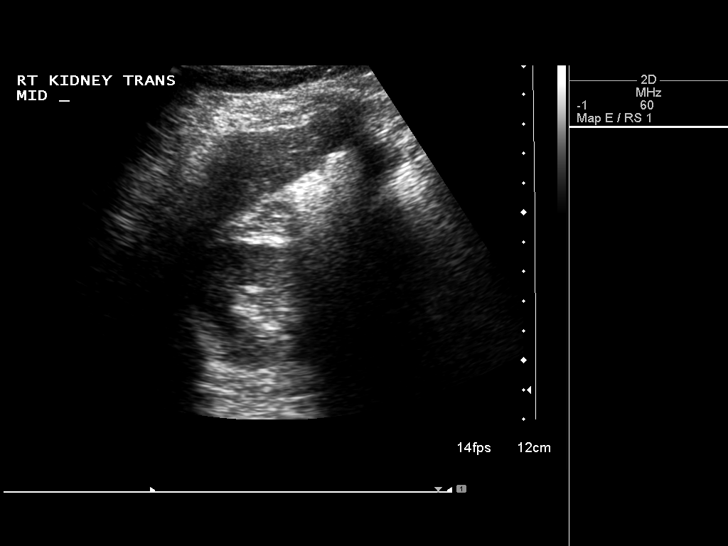
[im 36/66]
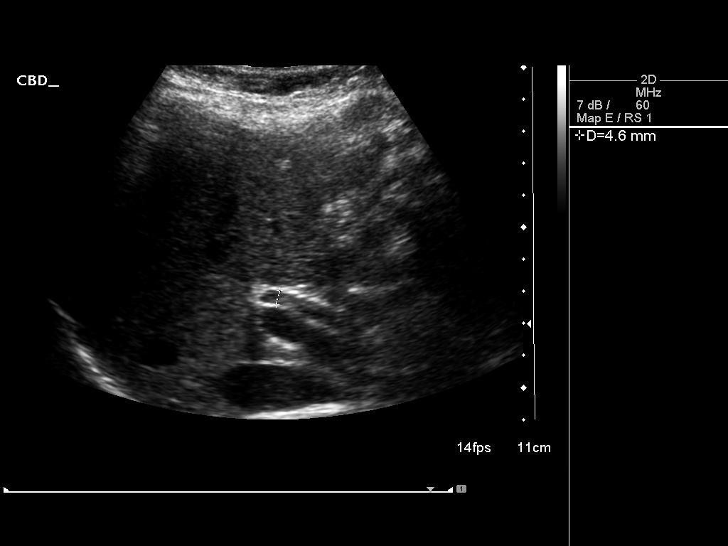
[im 41/66]
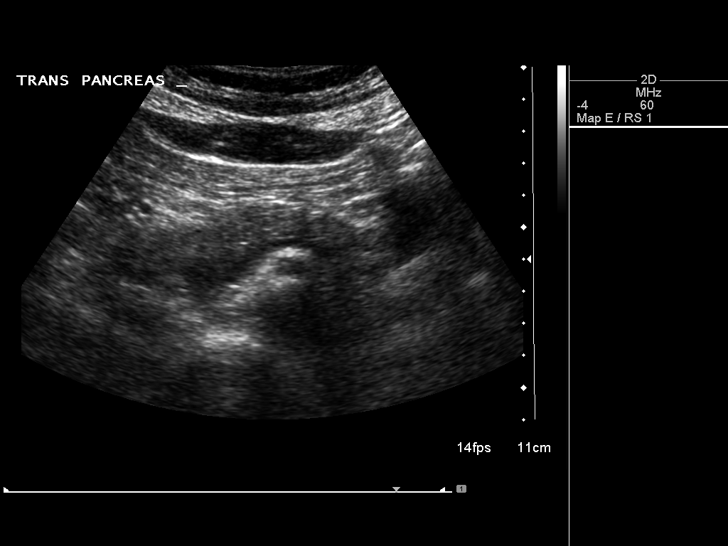
[im 44/66]
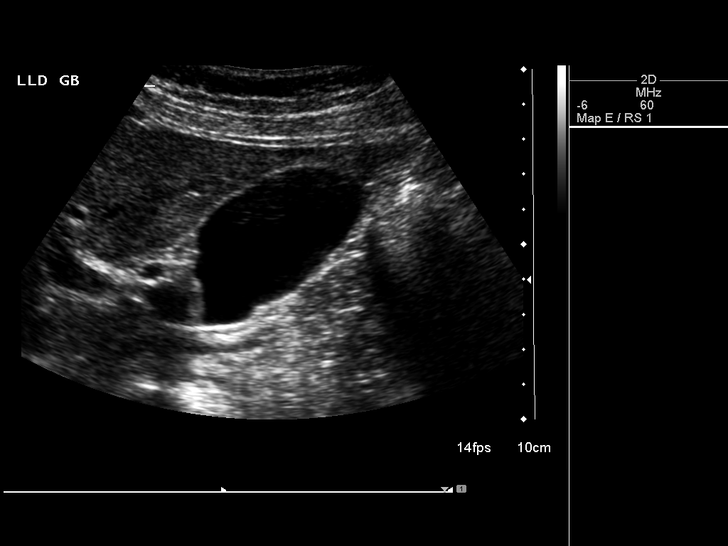
[im 49/66]
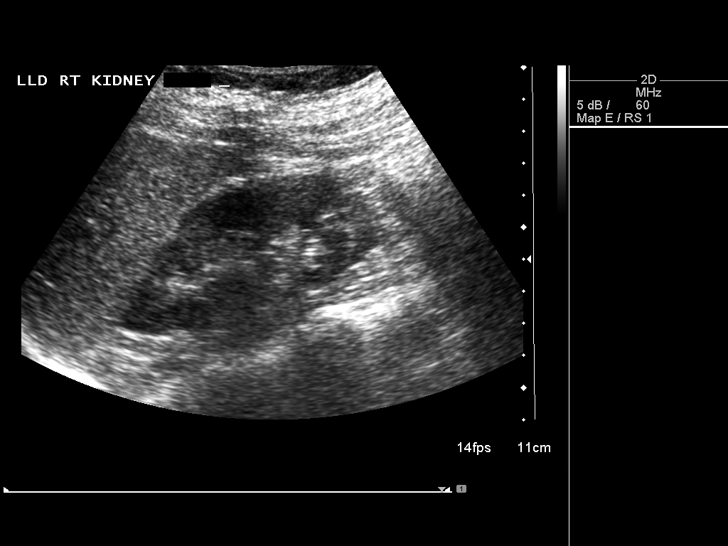
[im 55/66]
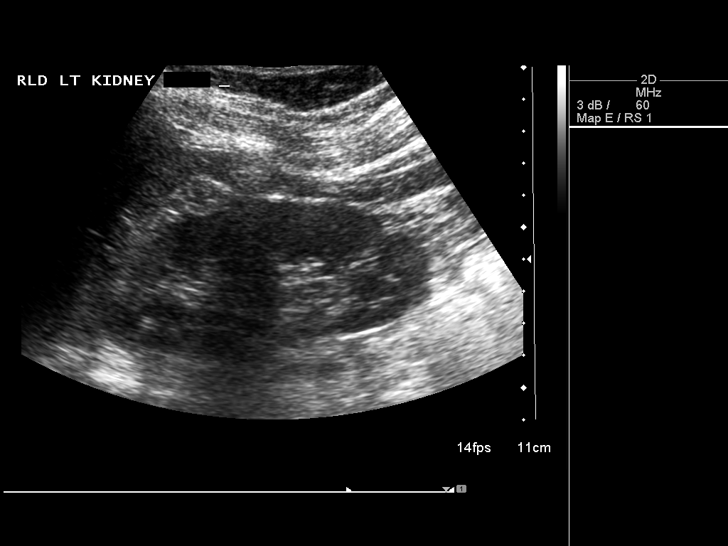
[im 60/66]
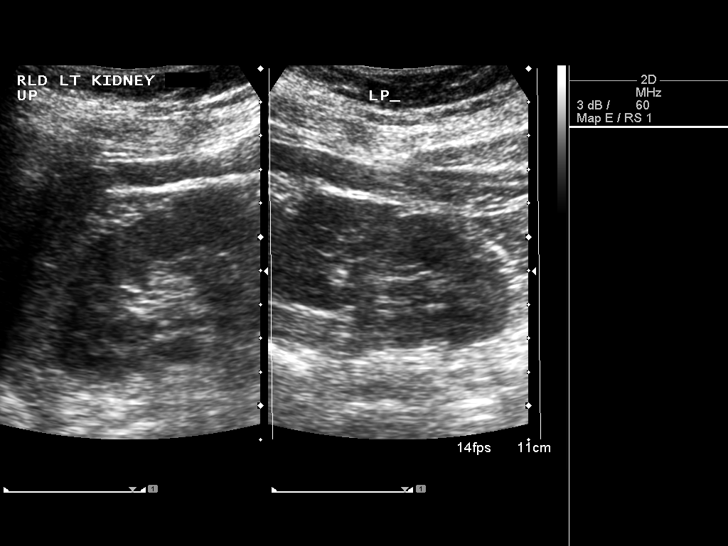
[im 66/66]
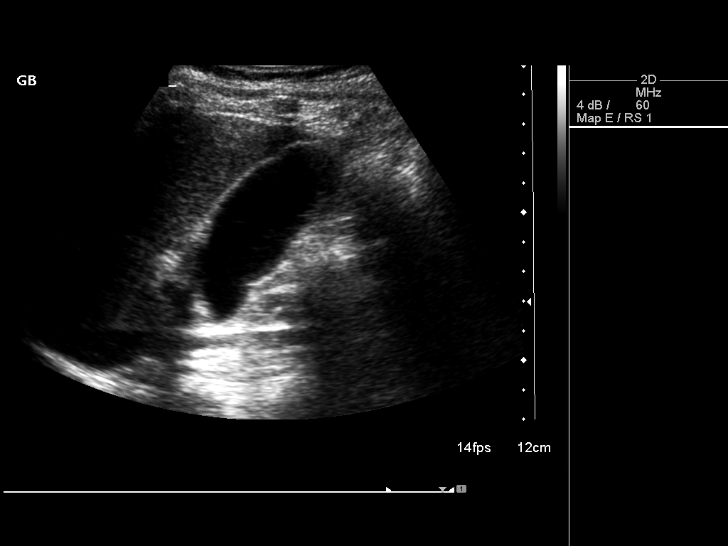

[14 of 25 positions shown; findings below may reference images not displayed]

FINDINGS: Gallbladder:  The gallbladder is visualized and no gallstones are
noted.  There is no pain over the gallbladder with compression.

Common bile duct:  The common bile duct is normal measuring up to
5.4 mm in diameter.

Liver:  The liver has a normal echogenic pattern.  No focal hepatic
abnormality is seen.  No ductal dilatation is noted.

IVC:  Appears normal.

Pancreas:  No focal abnormality seen.

Spleen:  The spleen is normal measuring 3.2 cm sagittally.

Right Kidney:  No hydronephrosis is seen.  The right kidney
measures 10.1 cm sagittally.

Left Kidney:  No hydronephrosis is noted.  The left kidney measures
10.8 cm.

Abdominal aorta:  The abdominal aorta is normal in caliber.
IMPRESSION: Negative abdominal ultrasound.  No hepatic abnormality is seen.

## 2013-02-03 ENCOUNTER — Other Ambulatory Visit: Payer: Self-pay | Admitting: Emergency Medicine

## 2013-02-04 ENCOUNTER — Other Ambulatory Visit: Payer: Self-pay | Admitting: *Deleted

## 2013-02-04 MED ORDER — SUMATRIPTAN SUCCINATE 50 MG PO TABS: ORAL_TABLET | ORAL | Status: DC

## 2013-04-30 ENCOUNTER — Emergency Department (HOSPITAL_COMMUNITY)
Admission: EM | Admit: 2013-04-30 | Discharge: 2013-04-30 | Disposition: A | Discharge status: Home / Self Care | Payer: BC Managed Care – PPO | Attending: Emergency Medicine | Admitting: Emergency Medicine

## 2013-04-30 ENCOUNTER — Emergency Department (HOSPITAL_COMMUNITY): Payer: BC Managed Care – PPO

## 2013-04-30 ENCOUNTER — Encounter (HOSPITAL_COMMUNITY): Payer: Self-pay | Admitting: Emergency Medicine

## 2013-04-30 ENCOUNTER — Ambulatory Visit (INDEPENDENT_AMBULATORY_CARE_PROVIDER_SITE_OTHER): Payer: BC Managed Care – PPO | Admitting: Emergency Medicine

## 2013-04-30 VITALS — BP 110/70 | HR 83 | Temp 98.1°F | Resp 16 | Ht 63.0 in | Wt 118.0 lb

## 2013-04-30 DIAGNOSIS — R9431 Abnormal electrocardiogram [ECG] [EKG]: Secondary | ICD-10-CM

## 2013-04-30 DIAGNOSIS — Z79899 Other long term (current) drug therapy: Secondary | ICD-10-CM | POA: Insufficient documentation

## 2013-04-30 DIAGNOSIS — Z8619 Personal history of other infectious and parasitic diseases: Secondary | ICD-10-CM | POA: Insufficient documentation

## 2013-04-30 DIAGNOSIS — G43909 Migraine, unspecified, not intractable, without status migrainosus: Secondary | ICD-10-CM | POA: Insufficient documentation

## 2013-04-30 DIAGNOSIS — Z862 Personal history of diseases of the blood and blood-forming organs and certain disorders involving the immune mechanism: Secondary | ICD-10-CM | POA: Insufficient documentation

## 2013-04-30 DIAGNOSIS — R079 Chest pain, unspecified: Secondary | ICD-10-CM

## 2013-04-30 DIAGNOSIS — R072 Precordial pain: Secondary | ICD-10-CM | POA: Insufficient documentation

## 2013-04-30 LAB — COMPREHENSIVE METABOLIC PANEL
ALT: 17 U/L (ref 0–35)
AST: 18 U/L (ref 0–37)
Albumin: 3.3 g/dL — ABNORMAL LOW (ref 3.5–5.2)
Alkaline Phosphatase: 56 U/L (ref 39–117)
BUN: 11 mg/dL (ref 6–23)
CALCIUM: 8.6 mg/dL (ref 8.4–10.5)
CHLORIDE: 105 meq/L (ref 96–112)
CO2: 26 meq/L (ref 19–32)
Creatinine, Ser: 0.83 mg/dL (ref 0.50–1.10)
GFR calc Af Amer: >90 mL/min (ref >90)
GFR, EST NON AFRICAN AMERICAN: 83 mL/min — AB (ref >90)
Glucose, Bld: 103 mg/dL — ABNORMAL HIGH (ref 70–99)
Potassium: 4.1 mEq/L (ref 3.7–5.3)
SODIUM: 141 meq/L (ref 137–147)
Total Bilirubin: 0.4 mg/dL (ref 0.3–1.2)
Total Protein: 6.8 g/dL (ref 6.0–8.3)

## 2013-04-30 LAB — CBC
HCT: 37.2 % (ref 36.0–46.0)
Hemoglobin: 12.8 g/dL (ref 12.0–15.0)
MCH: 32.1 pg (ref 26.0–34.0)
MCHC: 34.4 g/dL (ref 30.0–36.0)
MCV: 93.2 fL (ref 78.0–100.0)
PLATELETS: 267 10*3/uL (ref 150–400)
RBC: 3.99 MIL/uL (ref 3.87–5.11)
RDW: 12.5 % (ref 11.5–15.5)
WBC: 5.9 10*3/uL (ref 4.0–10.5)

## 2013-04-30 LAB — D-DIMER, QUANTITATIVE (NOT AT ARMC): D-Dimer, Quant: 0.59 ug/mL-FEU — ABNORMAL HIGH (ref 0.00–0.48)

## 2013-04-30 LAB — TROPONIN I: Troponin I: <0.3 ng/mL (ref <0.30)

## 2013-04-30 LAB — POCT I-STAT TROPONIN I: Troponin i, poc: 0 ng/mL (ref 0.00–0.08)

## 2013-04-30 MED ORDER — IOHEXOL 350 MG/ML SOLN
100.0000 mL | Freq: Once | INTRAVENOUS | Status: AC | PRN
  Administered 2013-04-30: 100 mL via INTRAVENOUS

## 2013-04-30 NOTE — ED Notes (Signed)
Per EMS pt from [facility] with c/o centralized chest pain radiating to bilateral arms. Pain lasted approx 30 minutes and now resolved. Pt sent here for further evaluation. No associated symptoms. VSS. EKG unremarkable. IV 20 LAC.

## 2013-04-30 NOTE — Progress Notes (Deleted)
   Subjective:    Patient ID: Dawn Hatfield, female    DOB: 1965-08-20, 48 y.o.   MRN: [id number]  HPI    Review of Systems     Objective:   Physical Exam        Assessment & Plan:

## 2013-04-30 NOTE — Discharge Instructions (Signed)
Follow up with your md next week. °

## 2013-04-30 NOTE — Progress Notes (Addendum)
Subjective:    Patient ID: Dawn Hatfield, female    DOB: Dec 05, 1965, 48 y.o.   MRN: [id number] This chart was scribed for Darlyne Russian, MD by Anastasia Pall, ED Scribe. This patient was seen in room 07 and the patient's care was started at 12:50 PM.  Chief Complaint  Patient presents with  . Chest Pain    today    HPI Dawn Hatfield is a 48 y.o. female She presents with one episode of substernal chest pain that radiates to her bilateral chest and down both arms, onset this morning around 10:00 AM while sitting down to go to the bathroom. She reports having a hard time describing her chest pain quality. She denies the chest pain being a burning sensation, but states it was sharp and came on suddenly, "like a bomb". She denies h/o similar pain. She reports afterward the chest pain, the pain radiated down her bilateral arms. She states that over the next 30 minutes her arm pain gradually subsided. She denies current chest pain, but reports current left shoulder pain.   She reports the last 2 nights she has experienced the bilateral arm pain. She reports trouble sleeping due to the pain, but denies having chest pain then. She states she thought the arm pain was originally due to her h/o pinched nerve, until her chest pain this morning. She denies recent travel, h/o smoking. She reports her BP usually runs low, but when she went to get her BP checked it was 123/83 and upon recheck was 119/80. She denies h/o EKG. She reports taking Viread for h/o Hep-B, Neurontin, Flexeril, and Valtrex. She reports running out of Flexeril recently. She reports taking Advil as needed for pain. She reports h/o tubal ligation. She denies SOB, nausea, diaphoresis, and any other associated symptoms.   PCP - Hayden Rasmussen., MD  Patient Active Problem List   Diagnosis Date Noted  . Hepatitis B 09/18/2011  . Migraine with aura 05/24/2011   Past Medical History  Diagnosis Date  . Migraine   . Hepatitis B   . Anemia     . Blood transfusion without reported diagnosis    History reviewed. No pertinent past surgical history. Allergies  Allergen Reactions  . Hydrocodone Nausea And Vomiting    dizziness   Prior to Admission medications   Medication Sig Start Date End Date Taking? Authorizing Provider  cyclobenzaprine (FLEXERIL) 10 MG tablet Take 1 tablet (10 mg total) by mouth 3 (three) times daily as needed. 04/02/12  Yes Ellison Carwin, MD  diazepam (VALIUM) 5 MG tablet 1/2-1 po q 12hrs prn headache 04/02/12  Yes Ellison Carwin, MD  gabapentin (NEURONTIN) 100 MG capsule Take 1 capsule (100 mg total) by mouth 3 (three) times daily. 05/25/12  Yes Ellison Carwin, MD  ibuprofen (ADVIL,MOTRIN) 800 MG tablet Take 1 tablet (800 mg total) by mouth every 8 (eight) hours as needed. 09/18/11  Yes Orma Flaming, MD  SUMAtriptan Dellis Filbert) 20 MG/ACT nasal spray One spray with onset of headache may repeat once after 2 hours if headache persists 05/24/11  Yes Hayden Rasmussen, MD  SUMAtriptan (IMITREX) 50 MG tablet TAKE 2 TABLETS (100 MG TOTAL) BY MOUTH EVERY 2 (TWO) HOURS AS NEEDED FOR MIGRAINE. 06/04/12  Yes Ryan M Dunn, PA-C  SUMAtriptan (IMITREX) 50 MG tablet Take 1 tab every 2hrs prn for migraine or headache.PATIENT NEEDS OFFICE VISIT FOR ADDITIONAL REFILLS 02/04/13  Yes Eleanore E Egan, PA-C  tenofovir (VIREAD) 300 MG tablet Take 300  mg by mouth daily.   Yes Historical Provider, MD  valACYclovir (VALTREX) 1000 MG tablet Take 1 tablet (1,000 mg total) by mouth 2 (two) times daily. 05/26/12  Yes Heather M Marte, PA-C  zolpidem (AMBIEN) 5 MG tablet Take 5 mg by mouth at bedtime as needed.   Yes Historical Provider, MD  promethazine (PHENERGAN) 25 MG tablet Take 1 tablet (25 mg total) by mouth every 8 (eight) hours as needed for nausea. 09/18/11 09/25/11  Orma Flaming, MD   Review of Systems  Constitutional: Negative for diaphoresis.  Respiratory: Negative for shortness of breath.   Cardiovascular: Positive for chest pain  (radiates to bilateral chest and down bilateral arms).  Gastrointestinal: Negative for nausea.      Objective:   Physical Exam Nursing note and vitals reviewed. Constitutional: Pt is oriented to person, place, and time. Pt appears well-developed and well-nourished. No distress.  HENT:  Head: Normocephalic and atraumatic.  Eyes: EOM are normal. Pupils are equal, round, and reactive to light.  Neck: Neck supple. No tracheal deviation present.  Cardiovascular: Normal rate, regular rhythm and normal heart sounds.  Exam reveals no gallop and no friction rub. No murmur heard. Pulmonary/Chest: Effort normal and breath sounds normal. No respiratory distress. Pt has no wheezes. Pt has no rales.  Abdominal: Soft. Bowel sounds are normal. There is no tenderness. There is no rebound and no guarding.  Musculoskeletal: Normal range of motion.  Neurological: Pt is alert and oriented to person, place, and time.  Skin: Skin is warm and dry.  Psychiatric: Pt has a normal mood and affect. Pt's behavior is normal.   BP 110/70  Pulse 83  Temp(Src) 98.1 F (36.7 C) (Oral)  Resp 16  Ht 5\' 3"  (1.6 m)  Wt 118 lb (53.524 kg)  BMI 20.91 kg/m2  SpO2 100% EKG THERE are nonspecific T changes 2,3, and  aVF as well as V3, V4 no ST elevation.    Assessment & Plan:  Patient low risk for cardiac disease but did have significant one hour of substernal pain. She has been having chronic neck issues and this may be related to this but there are minor changes on her EKG which would warrant further evaluation with enzymes . Patient placed on a monitor O2 and given 4 baby aspirin.

## 2013-05-01 NOTE — ED Provider Notes (Signed)
CSN: [id number]     Arrival date & time 04/30/13  1343 History   First MD Initiated Contact with Patient 04/30/13 1637     Chief Complaint  Patient presents with  . Chest Pain   (Consider location/radiation/quality/duration/timing/severity/associated sxs/prior Treatment) Patient is a 48 y.o. female presenting with chest pain. The history is provided by the patient (the pt had mild chest pain today).  Chest Pain Pain location:  Substernal area Pain quality: aching   Pain radiates to:  Does not radiate Pain radiates to the back: no   Pain severity:  Moderate Onset quality:  Sudden Timing:  Constant Progression:  Resolved Chronicity:  New Context: not breathing   Relieved by:  Nothing Associated symptoms: no abdominal pain, no back pain, no cough, no fatigue and no headache     Past Medical History  Diagnosis Date  . Migraine   . Hepatitis B   . Anemia   . Blood transfusion without reported diagnosis    History reviewed. No pertinent past surgical history. History reviewed. No pertinent family history. History  Substance Use Topics  . Smoking status: Never Smoker   . Smokeless tobacco: Not on file  . Alcohol Use: Yes     Comment: socially-wine   OB History   Grav Para Term Preterm Abortions TAB SAB Ect Mult Living                 Review of Systems  Constitutional: Negative for appetite change and fatigue.  HENT: Negative for congestion, ear discharge and sinus pressure.   Eyes: Negative for discharge.  Respiratory: Negative for cough.   Cardiovascular: Positive for chest pain.  Gastrointestinal: Negative for abdominal pain and diarrhea.  Genitourinary: Negative for frequency and hematuria.  Musculoskeletal: Negative for back pain.  Skin: Negative for rash.  Neurological: Negative for seizures and headaches.  Psychiatric/Behavioral: Negative for hallucinations.    Allergies  Hydrocodone  Home Medications   Current Outpatient Rx  Name  Route  Sig  Dispense   Refill  . gabapentin (NEURONTIN) 100 MG capsule   Oral   Take 100 mg by mouth daily.         Marland Kitchen ibuprofen (ADVIL,MOTRIN) 800 MG tablet   Oral   Take 800 mg by mouth every 8 (eight) hours as needed for moderate pain.         . SUMAtriptan (IMITREX) 50 MG tablet      Take 1 tab every 2hrs prn for migraine or headache.PATIENT NEEDS OFFICE VISIT FOR ADDITIONAL REFILLS   1 tablet   0   . tenofovir (VIREAD) 300 MG tablet   Oral   Take 300 mg by mouth daily.         . valACYclovir (VALTREX) 1000 MG tablet   Oral   Take 1,000 mg by mouth 2 (two) times daily as needed (outbreak).          BP 129/80  Pulse 64  Temp(Src) 97.9 F (36.6 C)  Resp 19  Wt 118 lb (53.524 kg)  SpO2 100% Physical Exam  Constitutional: She is oriented to person, place, and time. She appears well-developed.  HENT:  Head: Normocephalic.  Eyes: Conjunctivae and EOM are normal. No scleral icterus.  Neck: Neck supple. No thyromegaly present.  Cardiovascular: Normal rate and regular rhythm.  Exam reveals no gallop and no friction rub.   No murmur heard. Pulmonary/Chest: No stridor. She has no wheezes. She has no rales. She exhibits no tenderness.  Abdominal: She exhibits no  distension. There is no tenderness. There is no rebound.  Musculoskeletal: Normal range of motion. She exhibits no edema.  Lymphadenopathy:    She has no cervical adenopathy.  Neurological: She is oriented to person, place, and time. She exhibits normal muscle tone. Coordination normal.  Skin: No rash noted. No erythema.  Psychiatric: She has a normal mood and affect. Her behavior is normal.    ED Course  Procedures (including critical care time) Labs Review Labs Reviewed  COMPREHENSIVE METABOLIC PANEL - Abnormal; Notable for the following:    Glucose, Bld 103 (*)    Albumin 3.3 (*)    GFR calc non Af Amer 83 (*)    All other components within normal limits  D-DIMER, QUANTITATIVE - Abnormal; Notable for the following:     D-Dimer, Quant 0.59 (*)    All other components within normal limits  CBC  TROPONIN I  POCT I-STAT TROPONIN I   Imaging Review Dg Chest 2 View  04/30/2013   CLINICAL DATA:  CHEST PAIN  EXAM: CHEST  2 VIEW  COMPARISON:  None.  FINDINGS: The heart size and mediastinal contours are within normal limits. Both lungs are clear. The visualized skeletal structures are unremarkable.  IMPRESSION: No active cardiopulmonary disease.   Electronically Signed   By: Kathreen Devoid   On: 04/30/2013 14:44   Ct Angio Chest Pe W/cm &/or Wo Cm  04/30/2013   CLINICAL DATA:  Chest pain  EXAM: CT ANGIOGRAPHY CHEST WITH CONTRAST  TECHNIQUE: Multidetector CT imaging of the chest was performed using the standard protocol during bolus administration of intravenous contrast. Multiplanar CT image reconstructions including MIPs were obtained to evaluate the vascular anatomy.  CONTRAST:  125mL OMNIPAQUE IOHEXOL 350 MG/ML SOLN  COMPARISON:  None.  FINDINGS: The lungs are well aerated bilaterally without focal infiltrate or sizable effusion. No sizable parenchymal nodule is seen. Minimal scarring is noted in the right lung base laterally.  The thoracic aorta and pulmonary artery are within normal limits. No hilar or mediastinal adenopathy is seen. The visualized upper abdomen is within normal limits. No acute bony abnormality is noted.  Review of the MIP images confirms the above findings.  IMPRESSION: No acute abnormality is noted. No evidence of pulmonary embolism is seen.   Electronically Signed   By: Inez Catalina M.D.   On: 04/30/2013 18:42    EKG Interpretation    Date/Time:  Thursday April 30 2013 13:46:46 EST Ventricular Rate:  60 PR Interval:  130 QRS Duration: 74 QT Interval:  422 QTC Calculation: 422 R Axis:   81 Text Interpretation:  Normal sinus rhythm Possible Anterior infarct , age undetermined Abnormal ECG No old tracing to compare Confirmed by [facility]  MD, CHARLES [phone]) on 04/30/2013 1:59:32 PM             MDM   1. Chest pain        Maudry Diego, MD 05/01/13 1444

## 2013-06-29 ENCOUNTER — Other Ambulatory Visit: Payer: Self-pay | Admitting: Gastroenterology

## 2013-06-29 DIAGNOSIS — B1910 Unspecified viral hepatitis B without hepatic coma: Secondary | ICD-10-CM

## 2013-07-15 ENCOUNTER — Ambulatory Visit
Admission: RE | Admit: 2013-07-15 | Discharge: 2013-07-15 | Disposition: A | Discharge status: Home / Self Care | Payer: BC Managed Care – PPO | Source: Ambulatory Visit | Attending: Gastroenterology | Admitting: Gastroenterology

## 2013-07-15 DIAGNOSIS — B1910 Unspecified viral hepatitis B without hepatic coma: Secondary | ICD-10-CM

## 2014-07-12 ENCOUNTER — Other Ambulatory Visit: Payer: Self-pay | Admitting: Gastroenterology

## 2014-07-12 DIAGNOSIS — B1910 Unspecified viral hepatitis B without hepatic coma: Secondary | ICD-10-CM

## 2014-07-14 ENCOUNTER — Ambulatory Visit
Admission: RE | Admit: 2014-07-14 | Discharge: 2014-07-14 | Disposition: A | Discharge status: Home / Self Care | Payer: BLUE CROSS/BLUE SHIELD | Source: Ambulatory Visit | Attending: Gastroenterology | Admitting: Gastroenterology

## 2014-07-14 DIAGNOSIS — B1910 Unspecified viral hepatitis B without hepatic coma: Secondary | ICD-10-CM

## 2015-08-22 ENCOUNTER — Other Ambulatory Visit: Payer: Self-pay | Admitting: Gastroenterology

## 2015-08-22 DIAGNOSIS — B1910 Unspecified viral hepatitis B without hepatic coma: Secondary | ICD-10-CM

## 2015-08-31 ENCOUNTER — Ambulatory Visit
Admission: RE | Admit: 2015-08-31 | Discharge: 2015-08-31 | Disposition: A | Discharge status: Home / Self Care | Payer: BLUE CROSS/BLUE SHIELD | Source: Ambulatory Visit | Attending: Gastroenterology | Admitting: Gastroenterology

## 2015-08-31 DIAGNOSIS — B1910 Unspecified viral hepatitis B without hepatic coma: Secondary | ICD-10-CM

## 2016-11-18 ENCOUNTER — Encounter (HOSPITAL_COMMUNITY): Payer: Self-pay | Admitting: Nurse Practitioner

## 2016-11-18 ENCOUNTER — Emergency Department (HOSPITAL_COMMUNITY): Payer: BLUE CROSS/BLUE SHIELD

## 2016-11-18 ENCOUNTER — Emergency Department (HOSPITAL_COMMUNITY)
Admission: EM | Admit: 2016-11-18 | Discharge: 2016-11-18 | Disposition: A | Discharge status: Home / Self Care | Payer: BLUE CROSS/BLUE SHIELD | Attending: Emergency Medicine | Admitting: Emergency Medicine

## 2016-11-18 DIAGNOSIS — R0789 Other chest pain: Secondary | ICD-10-CM | POA: Insufficient documentation

## 2016-11-18 DIAGNOSIS — K219 Gastro-esophageal reflux disease without esophagitis: Secondary | ICD-10-CM

## 2016-11-18 DIAGNOSIS — R1013 Epigastric pain: Secondary | ICD-10-CM | POA: Diagnosis present

## 2016-11-18 LAB — CBC WITH DIFFERENTIAL/PLATELET
Basophils Absolute: 0 10*3/uL (ref 0.0–0.1)
Basophils Relative: 0 %
EOS PCT: 2 %
Eosinophils Absolute: 0.1 10*3/uL (ref 0.0–0.7)
HEMATOCRIT: 40.6 % (ref 36.0–46.0)
Hemoglobin: 14 g/dL (ref 12.0–15.0)
LYMPHS PCT: 38 %
Lymphs Abs: 1.7 10*3/uL (ref 0.7–4.0)
MCH: 32.2 pg (ref 26.0–34.0)
MCHC: 34.5 g/dL (ref 30.0–36.0)
MCV: 93.3 fL (ref 78.0–100.0)
MONO ABS: 0.3 10*3/uL (ref 0.1–1.0)
MONOS PCT: 7 %
NEUTROS ABS: 2.4 10*3/uL (ref 1.7–7.7)
Neutrophils Relative %: 53 %
PLATELETS: 305 10*3/uL (ref 150–400)
RBC: 4.35 MIL/uL (ref 3.87–5.11)
RDW: 13 % (ref 11.5–15.5)
WBC: 4.5 10*3/uL (ref 4.0–10.5)

## 2016-11-18 LAB — COMPREHENSIVE METABOLIC PANEL
ALBUMIN: 4.1 g/dL (ref 3.5–5.0)
ALT: 17 U/L (ref 14–54)
AST: 21 U/L (ref 15–41)
Alkaline Phosphatase: 49 U/L (ref 38–126)
Anion gap: 6 (ref 5–15)
BILIRUBIN TOTAL: 1.2 mg/dL (ref 0.3–1.2)
BUN: 9 mg/dL (ref 6–20)
CHLORIDE: 104 mmol/L (ref 101–111)
CO2: 28 mmol/L (ref 22–32)
CREATININE: 0.78 mg/dL (ref 0.44–1.00)
Calcium: 9.1 mg/dL (ref 8.9–10.3)
GFR calc Af Amer: >60 mL/min (ref >60)
GLUCOSE: 101 mg/dL — AB (ref 65–99)
POTASSIUM: 3.7 mmol/L (ref 3.5–5.1)
Sodium: 138 mmol/L (ref 135–145)
TOTAL PROTEIN: 7.6 g/dL (ref 6.5–8.1)

## 2016-11-18 LAB — URINALYSIS, ROUTINE W REFLEX MICROSCOPIC
BILIRUBIN URINE: NEGATIVE
GLUCOSE, UA: NEGATIVE mg/dL
HGB URINE DIPSTICK: NEGATIVE
KETONES UR: NEGATIVE mg/dL
Leukocytes, UA: NEGATIVE
Nitrite: NEGATIVE
PH: 6 (ref 5.0–8.0)
PROTEIN: NEGATIVE mg/dL
Specific Gravity, Urine: 1.008 (ref 1.005–1.030)

## 2016-11-18 LAB — LIPASE, BLOOD: Lipase: 32 U/L (ref 11–51)

## 2016-11-18 LAB — TROPONIN I

## 2016-11-18 MED ORDER — FAMOTIDINE IN NACL 20-0.9 MG/50ML-% IV SOLN
20.0000 mg | Freq: Once | INTRAVENOUS | Status: AC
  Administered 2016-11-18: 20 mg via INTRAVENOUS
  Filled 2016-11-18: qty 50

## 2016-11-18 MED ORDER — SODIUM CHLORIDE 0.9 % IV BOLUS (SEPSIS)
1000.0000 mL | Freq: Once | INTRAVENOUS | Status: AC
  Administered 2016-11-18: 1000 mL via INTRAVENOUS

## 2016-11-18 MED ORDER — PANTOPRAZOLE SODIUM 20 MG PO TBEC: 20.0000 mg | DELAYED_RELEASE_TABLET | Freq: Every day | ORAL | 0 refills | Status: AC

## 2016-11-18 MED ORDER — GI COCKTAIL ~~LOC~~
30.0000 mL | Freq: Once | ORAL | Status: AC
  Administered 2016-11-18: 30 mL via ORAL
  Filled 2016-11-18: qty 30

## 2016-11-18 NOTE — ED Triage Notes (Signed)
Pt is c/o epigastric pain radiating to chest, describes pain as burning sensation. States she suspects "food might have gone down the wrong way."

## 2016-11-18 NOTE — ED Provider Notes (Signed)
[facility] DEPT Provider Note   CSN: [id number] Arrival date & time: 11/18/16  9242     History   Chief Complaint Chief Complaint  Patient presents with  . Abdominal Pain    HPI Dawn Hatfield is a 51 y.o. female.  Pt presents to the ED today with epigastric and cp.  The pt said she's had pain with swallowing.  She describes the pain as a burning sensation.  She has never had anything like this in the past and did not take anything otc prior to arrival here.  She googled her sx and was worried that she aspirated food.  She denies any fevers or cough.      Past Medical History:  Diagnosis Date  . Anemia   . Blood transfusion without reported diagnosis   . Hepatitis B   . Migraine     Patient Active Problem List   Diagnosis Date Noted  . Hepatitis B 09/18/2011  . Migraine with aura 05/24/2011    History reviewed. No pertinent surgical history.  OB History    No data available       Home Medications    Prior to Admission medications   Medication Sig Start Date End Date Taking? Authorizing Provider  aspirin-acetaminophen-caffeine (EXCEDRIN MIGRAINE) [phone] MG tablet Take 2 tablets by mouth every 6 (six) hours as needed for migraine.   Yes [provider]  estradiol (VIVELLE-DOT) 0.05 MG/24HR patch Place 1 patch onto the skin 2 (two) times a week. Pt places patch on Thursday and Sunday.   Yes [provider]  ibuprofen (ADVIL,MOTRIN) 200 MG tablet Take 800 mg by mouth every 6 (six) hours as needed for headache, mild pain or moderate pain.   Yes [provider]  Tenofovir Alafenamide Fumarate (VEMLIDY) 25 MG TABS Take 25 mg by mouth at bedtime.   Yes [provider]  valACYclovir (VALTREX) 1000 MG tablet Take 1,000 mg by mouth 2 (two) times daily as needed (for cold sores).    Yes [provider]  pantoprazole (PROTONIX) 20 MG tablet Take 1 tablet (20 mg total) by mouth daily. 11/18/16   Isla Pence, MD     Family History History reviewed. No pertinent family history.  Social History Social History  Substance Use Topics  . Smoking status: Never Smoker  . Smokeless tobacco: Never Used  . Alcohol use Yes     Comment: socially-wine     Allergies   Hydrocodone   Review of Systems Review of Systems  Cardiovascular: Positive for chest pain.  All other systems reviewed and are negative.    Physical Exam Updated Vital Signs BP (!) 119/103   Pulse (!) 53   Temp 97.8 F (36.6 C) (Oral)   Resp (!) 24   SpO2 100%   Physical Exam  Constitutional: She is oriented to person, place, and time. She appears well-developed and well-nourished.  HENT:  Head: Normocephalic and atraumatic.  Right Ear: External ear normal.  Left Ear: External ear normal.  Nose: Nose normal.  Mouth/Throat: Oropharynx is clear and moist.  Eyes: Pupils are equal, round, and reactive to light. Conjunctivae and EOM are normal.  Neck: Normal range of motion. Neck supple.  Cardiovascular: Normal rate, regular rhythm, normal heart sounds and intact distal pulses.   Pulmonary/Chest: Effort normal and breath sounds normal.  Abdominal: Soft. Bowel sounds are normal.  Musculoskeletal: Normal range of motion.  Neurological: She is alert and oriented to person, place, and time.  Skin: Skin is warm.  Psychiatric: She has a normal mood and affect. Her behavior is normal. Judgment and thought content normal.  Nursing note and vitals reviewed.    ED Treatments / Results  Labs (all labs ordered are listed, but only abnormal results are displayed) Labs Reviewed  COMPREHENSIVE METABOLIC PANEL - Abnormal; Notable for the following:       Result Value   Glucose, Bld 101 (*)    All other components within normal limits  TROPONIN I  CBC WITH DIFFERENTIAL/PLATELET  URINALYSIS, ROUTINE W REFLEX MICROSCOPIC  LIPASE, BLOOD    EKG  EKG Interpretation  Date/Time:  Sunday November 18 2016 02:45:47 EDT Ventricular  Rate:  58 PR Interval:    QRS Duration: 78 QT Interval:  568 QTC Calculation: 558 R Axis:   77 Text Interpretation:  Sinus rhythm Probable anterior infarct, age indeterminate Prolonged QT interval Nonspecific T wave abnormality not seen previously Confirmed by Shanon Rosser [phone]) on 11/18/2016 6:52:09 AM       Radiology Dg Chest 2 View  Result Date: 11/18/2016 CLINICAL DATA:  Epigastric pain radiating to the chest EXAM: CHEST  2 VIEW COMPARISON:  04/30/2013 FINDINGS: The heart size and mediastinal contours are within normal limits. Both lungs are clear. The visualized skeletal structures are unremarkable. IMPRESSION: No active cardiopulmonary disease. Electronically Signed   By: Donavan Foil M.D.   On: 11/18/2016 03:19    Procedures Procedures (including critical care time)  Medications Ordered in ED Medications  sodium chloride 0.9 % bolus 1,000 mL (1,000 mLs Intravenous New Bag/Given 11/18/16 0757)  famotidine (PEPCID) IVPB 20 mg premix (20 mg Intravenous New Bag/Given 11/18/16 0758)  gi cocktail (Maalox,Lidocaine,Donnatal) (30 mLs Oral Given 11/18/16 0758)     Initial Impression / Assessment and Plan / ED Course  I have reviewed the triage vital signs and the nursing notes.  Pertinent labs & imaging results that were available during my care of the patient were reviewed by me and considered in my medical decision making (see chart for details).    Pt mentioned that she developed these sx after changing her Hep B medication.  She is followed by Dr. Earlean Shawl, so I told her to call him tomorrow for a follow up.  Possible EGD if sx persist.  She has been able to eat and drink.  She knows to return if worse.  Final Clinical Impressions(s) / ED Diagnoses   Final diagnoses:  Gastroesophageal reflux disease, esophagitis presence not specified    New Prescriptions New Prescriptions   PANTOPRAZOLE (PROTONIX) 20 MG TABLET    Take 1 tablet (20 mg total) by mouth daily.     Isla Pence, MD 11/18/16 [phone]

## 2016-11-29 ENCOUNTER — Encounter: Payer: Self-pay | Admitting: Emergency Medicine

## 2016-11-30 ENCOUNTER — Encounter: Payer: Self-pay | Admitting: Family Medicine

## 2016-11-30 DIAGNOSIS — R1310 Dysphagia, unspecified: Secondary | ICD-10-CM | POA: Insufficient documentation

## 2016-11-30 DIAGNOSIS — R1319 Other dysphagia: Secondary | ICD-10-CM | POA: Insufficient documentation

## 2019-06-11 ENCOUNTER — Other Ambulatory Visit: Payer: Self-pay | Admitting: Gastroenterology

## 2019-06-11 DIAGNOSIS — Z8619 Personal history of other infectious and parasitic diseases: Secondary | ICD-10-CM

## 2019-06-24 ENCOUNTER — Ambulatory Visit
Admission: RE | Admit: 2019-06-24 | Discharge: 2019-06-24 | Disposition: A | Discharge status: Home / Self Care | Payer: BLUE CROSS/BLUE SHIELD | Source: Ambulatory Visit | Attending: Gastroenterology | Admitting: Gastroenterology

## 2019-06-24 DIAGNOSIS — Z8619 Personal history of other infectious and parasitic diseases: Secondary | ICD-10-CM

## 2019-08-03 ENCOUNTER — Encounter: Payer: Self-pay | Admitting: Family Medicine

## 2020-09-13 ENCOUNTER — Other Ambulatory Visit: Payer: Self-pay | Admitting: Gastroenterology

## 2020-09-13 DIAGNOSIS — Z8619 Personal history of other infectious and parasitic diseases: Secondary | ICD-10-CM

## 2021-02-13 ENCOUNTER — Other Ambulatory Visit: Payer: Self-pay | Admitting: Gastroenterology

## 2021-02-13 DIAGNOSIS — Z8619 Personal history of other infectious and parasitic diseases: Secondary | ICD-10-CM

## 2021-02-16 ENCOUNTER — Ambulatory Visit
Admission: RE | Admit: 2021-02-16 | Discharge: 2021-02-16 | Disposition: A | Discharge status: Home / Self Care | Payer: 59 | Source: Ambulatory Visit | Attending: Gastroenterology | Admitting: Gastroenterology

## 2021-02-16 DIAGNOSIS — Z8619 Personal history of other infectious and parasitic diseases: Secondary | ICD-10-CM

## 2022-02-14 ENCOUNTER — Other Ambulatory Visit: Payer: Self-pay | Admitting: Gastroenterology

## 2022-02-14 DIAGNOSIS — Z8619 Personal history of other infectious and parasitic diseases: Secondary | ICD-10-CM

## 2022-02-28 ENCOUNTER — Other Ambulatory Visit: Payer: Self-pay | Admitting: Family Medicine

## 2022-02-28 ENCOUNTER — Ambulatory Visit
Admission: RE | Admit: 2022-02-28 | Discharge: 2022-02-28 | Disposition: A | Discharge status: Home / Self Care | Payer: 59 | Source: Ambulatory Visit | Attending: Gastroenterology | Admitting: Gastroenterology

## 2022-02-28 DIAGNOSIS — Z1231 Encounter for screening mammogram for malignant neoplasm of breast: Secondary | ICD-10-CM

## 2022-02-28 DIAGNOSIS — Z8619 Personal history of other infectious and parasitic diseases: Secondary | ICD-10-CM

## 2022-04-25 ENCOUNTER — Ambulatory Visit: Payer: 59

## 2023-05-30 ENCOUNTER — Other Ambulatory Visit: Payer: Self-pay | Admitting: Gastroenterology

## 2023-05-30 DIAGNOSIS — B181 Chronic viral hepatitis B without delta-agent: Secondary | ICD-10-CM

## 2023-06-17 ENCOUNTER — Other Ambulatory Visit: Payer: 59
